# Patient Record
Sex: Female | Born: 1985 | Race: Black or African American | Hispanic: No | Marital: Married | Smoking: Never smoker
Health system: Southern US, Community
[De-identification: ages and names within clinical notes are randomized; demographics above are authoritative.]

## PROBLEM LIST (undated history)

## (undated) ENCOUNTER — Inpatient Hospital Stay (HOSPITAL_COMMUNITY): Payer: Self-pay

## (undated) DIAGNOSIS — E119 Type 2 diabetes mellitus without complications: Secondary | ICD-10-CM

---

## 2014-01-12 DIAGNOSIS — E669 Obesity, unspecified: Secondary | ICD-10-CM | POA: Insufficient documentation

## 2017-02-10 LAB — OB RESULTS CONSOLE HIV ANTIBODY (ROUTINE TESTING): HIV: NONREACTIVE

## 2017-02-10 LAB — OB RESULTS CONSOLE HEPATITIS B SURFACE ANTIGEN: Hepatitis B Surface Ag: NEGATIVE

## 2017-02-10 LAB — OB RESULTS CONSOLE RPR: RPR: NONREACTIVE

## 2017-02-10 LAB — OB RESULTS CONSOLE GC/CHLAMYDIA
Chlamydia: NEGATIVE
GC PROBE AMP, GENITAL: NEGATIVE

## 2017-02-10 LAB — OB RESULTS CONSOLE RUBELLA ANTIBODY, IGM: Rubella: IMMUNE

## 2017-02-25 NOTE — L&D Delivery Note (Addendum)
Delivery Note At 8:37 AM a viable female was delivered via Vaginal, Spontaneous (Presentation: ;  ).  APGAR: 5, 7; weight 4 lb 13.6 oz (2200 g).   Placenta status: , .  Cord:  with the following complications: .  Cord pH: sent  Anesthesia:  Epidural Episiotomy:   Lacerations: 2nd degree Suture Repair: 2.0 chromic Est. Blood Loss (mL): 300  Mom to postpartum.  Baby to NICU.  Nuchal cord times one and true knot in umbilical cord  Meghan Howard A 07/16/2017, 9:28 AM

## 2017-05-01 ENCOUNTER — Encounter (HOSPITAL_COMMUNITY): Payer: Self-pay

## 2017-05-01 ENCOUNTER — Other Ambulatory Visit: Payer: Self-pay

## 2017-05-01 ENCOUNTER — Inpatient Hospital Stay (HOSPITAL_COMMUNITY)
Admission: AD | Admit: 2017-05-01 | Discharge: 2017-05-05 | DRG: 819 | Disposition: A | Discharge status: Home / Self Care | Payer: BLUE CROSS/BLUE SHIELD | Source: Ambulatory Visit | Attending: Obstetrics & Gynecology | Admitting: Obstetrics & Gynecology

## 2017-05-01 DIAGNOSIS — Z3A21 21 weeks gestation of pregnancy: Secondary | ICD-10-CM

## 2017-05-01 DIAGNOSIS — Z880 Allergy status to penicillin: Secondary | ICD-10-CM

## 2017-05-01 DIAGNOSIS — O26879 Cervical shortening, unspecified trimester: Secondary | ICD-10-CM

## 2017-05-01 DIAGNOSIS — Z3686 Encounter for antenatal screening for cervical length: Secondary | ICD-10-CM

## 2017-05-01 DIAGNOSIS — O26872 Cervical shortening, second trimester: Secondary | ICD-10-CM

## 2017-05-01 DIAGNOSIS — O3432 Maternal care for cervical incompetence, second trimester: Principal | ICD-10-CM | POA: Diagnosis present

## 2017-05-01 DIAGNOSIS — O99212 Obesity complicating pregnancy, second trimester: Secondary | ICD-10-CM | POA: Diagnosis present

## 2017-05-01 DIAGNOSIS — Z3A22 22 weeks gestation of pregnancy: Secondary | ICD-10-CM

## 2017-05-01 LAB — CBC
HEMATOCRIT: 31.6 % — AB (ref 36.0–46.0)
Hemoglobin: 10.5 g/dL — ABNORMAL LOW (ref 12.0–15.0)
MCH: 28.7 pg (ref 26.0–34.0)
MCHC: 33.2 g/dL (ref 30.0–36.0)
MCV: 86.3 fL (ref 78.0–100.0)
Platelets: 237 10*3/uL (ref 150–400)
RBC: 3.66 MIL/uL — ABNORMAL LOW (ref 3.87–5.11)
RDW: 13.4 % (ref 11.5–15.5)
WBC: 11.9 10*3/uL — ABNORMAL HIGH (ref 4.0–10.5)

## 2017-05-01 LAB — URINALYSIS, ROUTINE W REFLEX MICROSCOPIC
BILIRUBIN URINE: NEGATIVE
GLUCOSE, UA: NEGATIVE mg/dL
HGB URINE DIPSTICK: NEGATIVE
Ketones, ur: 80 mg/dL — AB
Leukocytes, UA: NEGATIVE
NITRITE: NEGATIVE
PH: 5 (ref 5.0–8.0)
PROTEIN: NEGATIVE mg/dL
Specific Gravity, Urine: 1.014 (ref 1.005–1.030)

## 2017-05-01 LAB — TYPE AND SCREEN
ABO/RH(D): O POS
Antibody Screen: NEGATIVE

## 2017-05-01 MED ORDER — LACTATED RINGERS IV SOLN
INTRAVENOUS | Status: DC
  Administered 2017-05-01 – 2017-05-03 (×6): via INTRAVENOUS

## 2017-05-01 MED ORDER — PROGESTERONE MICRONIZED 200 MG PO CAPS
200.0000 mg | ORAL_CAPSULE | Freq: Every day | ORAL | Status: DC
  Administered 2017-05-01 – 2017-05-05 (×5): 200 mg via VAGINAL
  Filled 2017-05-01 (×5): qty 1

## 2017-05-01 MED ORDER — PROGESTERONE MICRONIZED 200 MG PO CAPS: 200.0000 mg | ORAL_CAPSULE | Freq: Every day | ORAL | Status: DC

## 2017-05-01 NOTE — H&P (Signed)
Anterpartum History and Physical  [name] is 32 year old G1P0 at 21 weeks 5 days sent from office For admission for possible rescue cerclage placement for cervical insufficiency. Last week at her initial anatomy ultrasound her cervix was noted to be dynamic 2.5-3.1cm And today it was measuring 0.8cm.  Her cervix was closed on examination by Dr. Estanislado Howard.  Patient denies vaginal bleeding or leaking of fluid. She denies pelvic pain or contractions.  She reports feeling movement.  Her prenatal labs thus far have been normal  Pregnancy complicated by Cervical insufficiency  PMHX History reviewed. No pertinent past medical history.  PSURG HX History reviewed. No pertinent surgical history.  FAM HX  Family History  Problem Relation Age of Onset  . Diabetes Mother   . Diabetes Father     SOC HX  Social History   Socioeconomic History  . Marital status: Married    Spouse name: Not on file  . Number of children: Not on file  . Years of education: Not on file  . Highest education level: Not on file  Social Needs  . Financial resource strain: Not on file  . Food insecurity - worry: Not on file  . Food insecurity - inability: Not on file  . Transportation needs - medical: Not on file  . Transportation needs - non-medical: Not on file  Occupational History  . Not on file  Tobacco Use  . Smoking status: Not on file  Substance and Sexual Activity  . Alcohol use: No    Frequency: Never  . Drug use: No  . Sexual activity: Not on file  Other Topics Concern  . Not on file  Social History Narrative  . Not on file    ROS  Review of Systems - as per HPI  US in office: SIUP cephalic anterior placenta cervical shortening 0.8-1.1cm with funnel  Physical Exam:  Not done  Assessment: IUP 4157w6d  Plan: Admit to antepartum NPO Cerclage in am by Dr. Estanislado Howard MFM consultation  [name], Meghan Howard

## 2017-05-02 ENCOUNTER — Observation Stay (HOSPITAL_COMMUNITY): Payer: BLUE CROSS/BLUE SHIELD | Admitting: Anesthesiology

## 2017-05-02 ENCOUNTER — Encounter (HOSPITAL_COMMUNITY)
Admission: AD | Disposition: A | Discharge status: Home / Self Care | Payer: Self-pay | Source: Ambulatory Visit | Attending: Obstetrics & Gynecology

## 2017-05-02 ENCOUNTER — Observation Stay (HOSPITAL_COMMUNITY): Payer: BLUE CROSS/BLUE SHIELD

## 2017-05-02 ENCOUNTER — Encounter (HOSPITAL_COMMUNITY): Payer: Self-pay | Admitting: Anesthesiology

## 2017-05-02 HISTORY — PX: CERVICAL CERCLAGE: SHX1329

## 2017-05-02 LAB — ABO/RH: ABO/RH(D): O POS

## 2017-05-02 SURGERY — CERCLAGE, CERVIX, VAGINAL APPROACH
Anesthesia: Spinal | Site: Vagina

## 2017-05-02 MED ORDER — IBUPROFEN 100 MG/5ML PO SUSP
600.0000 mg | Freq: Four times a day (QID) | ORAL | Status: AC
  Administered 2017-05-02 – 2017-05-05 (×12): 600 mg via ORAL
  Filled 2017-05-02 (×12): qty 30

## 2017-05-02 MED ORDER — ONDANSETRON HCL 4 MG/2ML IJ SOLN
INTRAMUSCULAR | Status: DC | PRN
  Administered 2017-05-02: 4 mg via INTRAVENOUS

## 2017-05-02 MED ORDER — PHENYLEPHRINE HCL 10 MG/ML IJ SOLN
INTRAMUSCULAR | Status: DC | PRN
  Administered 2017-05-02 (×3): 80 ug via INTRAVENOUS

## 2017-05-02 MED ORDER — FENTANYL CITRATE (PF) 100 MCG/2ML IJ SOLN: 25.0000 ug | INTRAMUSCULAR | Status: DC | PRN

## 2017-05-02 MED ORDER — IBUPROFEN 600 MG PO TABS
600.0000 mg | ORAL_TABLET | Freq: Four times a day (QID) | ORAL | Status: DC
  Filled 2017-05-02: qty 1

## 2017-05-02 MED ORDER — ONDANSETRON HCL 4 MG/2ML IJ SOLN
INTRAMUSCULAR | Status: AC
  Filled 2017-05-02: qty 2

## 2017-05-02 MED ORDER — SOD CITRATE-CITRIC ACID 500-334 MG/5ML PO SOLN
ORAL | Status: AC
  Administered 2017-05-02: 30 mL
  Filled 2017-05-02: qty 15

## 2017-05-02 MED ORDER — PHENYLEPHRINE 40 MCG/ML (10ML) SYRINGE FOR IV PUSH (FOR BLOOD PRESSURE SUPPORT)
PREFILLED_SYRINGE | INTRAVENOUS | Status: AC
  Filled 2017-05-02: qty 10

## 2017-05-02 MED ORDER — CLINDAMYCIN PHOSPHATE 900 MG/50ML IV SOLN
900.0000 mg | Freq: Once | INTRAVENOUS | Status: AC
  Administered 2017-05-02: 900 mg via INTRAVENOUS
  Filled 2017-05-02: qty 50

## 2017-05-02 SURGICAL SUPPLY — 17 items
CANISTER SUCT 3000ML PPV (MISCELLANEOUS) ×2 IMPLANT
CATH FOLEY 2WAY SLVR 18FR 30CC (CATHETERS) ×2 IMPLANT
GLOVE BIOGEL PI IND STRL 7.0 (GLOVE) ×2 IMPLANT
GLOVE BIOGEL PI INDICATOR 7.0 (GLOVE) ×2
GOWN STRL REUS W/TWL LRG LVL3 (GOWN DISPOSABLE) ×4 IMPLANT
NEEDLE MAYO CATGUT SZ4 (NEEDLE) ×2 IMPLANT
PACK VAGINAL MINOR WOMEN LF (CUSTOM PROCEDURE TRAY) ×2 IMPLANT
PAD OB MATERNITY 4.3X12.25 (PERSONAL CARE ITEMS) ×2 IMPLANT
PAD PREP 24X48 CUFFED NSTRL (MISCELLANEOUS) ×2 IMPLANT
SUT MERSILENE 5MM BP 1 12 (SUTURE) ×2 IMPLANT
SUT PROLENE 0 CT 1 30 (SUTURE) ×2 IMPLANT
SYRINGE 35CC LL (MISCELLANEOUS) ×2 IMPLANT
TOWEL OR 17X24 6PK STRL BLUE (TOWEL DISPOSABLE) ×4 IMPLANT
TRAY FOLEY CATH SILVER 14FR (SET/KITS/TRAYS/PACK) ×2 IMPLANT
TUBING NON-CON 1/4 X 20 CONN (TUBING) ×2 IMPLANT
WATER STERILE IRR 1000ML POUR (IV SOLUTION) ×2 IMPLANT
YANKAUER SUCT BULB TIP NO VENT (SUCTIONS) ×2 IMPLANT

## 2017-05-02 NOTE — Anesthesia Preprocedure Evaluation (Signed)
Anesthesia Evaluation  Patient identified by MRN, date of birth, ID band Patient awake    Reviewed: Allergy & Precautions, H&P , Patient's Chart, lab work & pertinent test results  Airway Mallampati: II  TM Distance: >3 FB Neck ROM: full    Dental no notable dental hx.    Pulmonary    Pulmonary exam normal breath sounds clear to auscultation       Cardiovascular Exercise Tolerance: Good  Rhythm:regular Rate:Normal     Neuro/Psych    GI/Hepatic   Endo/Other  Morbid obesity  Renal/GU      Musculoskeletal   Abdominal   Peds  Hematology   Anesthesia Other Findings   Reproductive/Obstetrics                             Anesthesia Physical Anesthesia Plan  ASA: II  Anesthesia Plan: Spinal   Post-op Pain Management:    Induction:   PONV Risk Score and Plan: Treatment may vary due to age or medical condition and Ondansetron  Airway Management Planned:   Additional Equipment:   Intra-op Plan:   Post-operative Plan:   Informed Consent: I have reviewed the patients History and Physical, chart, labs and discussed the procedure including the risks, benefits and alternatives for the proposed anesthesia with the patient or authorized representative who has indicated his/her understanding and acceptance.     Plan Discussed with:   Anesthesia Plan Comments: (  )        Anesthesia Quick Evaluation

## 2017-05-02 NOTE — Progress Notes (Signed)
Post-procedure visit Patient is doing well, mild cramping now resolving. Op findings reviewed and questions answered.  Will check CBC in am as patient is high risk for infection/SROM Plan u/s for cervical length next week

## 2017-05-02 NOTE — Anesthesia Postprocedure Evaluation (Signed)
Anesthesia Post Note  Patient: Meghan Howard  Procedure(s) Performed: CERCLAGE CERVICAL (N/A Vagina )     Patient location during evaluation: [facility] Anesthesia Type: Spinal Level of consciousness: awake and alert and oriented Pain management: satisfactory to patient Vital Signs Assessment: post-procedure vital signs reviewed and stable Respiratory status: respiratory function stable and spontaneous breathing Cardiovascular status: blood pressure returned to baseline Postop Assessment: no headache, no backache, spinal receding, patient able to bend at knees and adequate PO intake Anesthetic complications: no    Last Vitals:  Vitals:   05/02/17 1625 05/02/17 1753  BP: 122/68 116/73  Pulse: 96 (!) 113  Resp: 18   Temp: 37.1 C 37.5 C  SpO2: 100% 98%    Last Pain:  Vitals:   05/02/17 1753  TempSrc: Oral  PainSc:    Pain Goal:                 Meghan Howard

## 2017-05-02 NOTE — Addendum Note (Signed)
Addendum  created 05/02/17 1831 by Matayah Reyburn O, CRNA   Sign clinical note    

## 2017-05-02 NOTE — Anesthesia Postprocedure Evaluation (Signed)
Anesthesia Post Note  Patient: Meghan Howard  Procedure(s) Performed: CERCLAGE CERVICAL (N/A Vagina )     Patient location during evaluation: PACU Anesthesia Type: Spinal Level of consciousness: awake Pain management: satisfactory to patient Vital Signs Assessment: post-procedure vital signs reviewed and stable Respiratory status: spontaneous breathing Cardiovascular status: blood pressure returned to baseline Postop Assessment: no headache and spinal receding Anesthetic complications: no    Last Vitals:  Vitals:   05/02/17 1509 05/02/17 1625  BP: 121/81 122/68  Pulse: 91 96  Resp: 18 18  Temp: 37.4 C 37.1 C  SpO2: 100% 100%    Last Pain:  Vitals:   05/02/17 1625  TempSrc: Oral  PainSc:    Pain Goal:                 Jiles GarterJACKSON,Haakon Titsworth EDWARD

## 2017-05-02 NOTE — Anesthesia Procedure Notes (Signed)
Spinal  Patient location during procedure: OR Staffing Anesthesiologist: Cristela BlueJackson, Caisen Mangas, MD Spinal Block Patient position: sitting Prep: DuraPrep Patient monitoring: heart rate, blood pressure and continuous pulse ox Approach: right paramedian Location: L3-4 Injection technique: single-shot Needle Needle type: Sprotte  Needle gauge: 24 G Needle length: 9 cm Assessment Sensory level: T4 Additional Notes Spinal Dosage in OR  .75% Bupivicaine ml       1.2

## 2017-05-02 NOTE — Op Note (Signed)
Preoperative diagnosis:  22 weeks pregnancy with cervical incompetence  Postoperative diagnosis: Same  Anesthesia: Spinal  Anesthesiologist: Dr Cristela BlueKyle Jackson  Procedure: McDonald rescue cervical cerclage   Surgeon: Dr. Dois DavenportSandra Tachina Spoonemore  Assistant: None  Estimated blood loss: Minimal  Procedure:  After being informed of the planned procedure with possible complications including but not limited to bleeding, infection,accidental rupture of membranes with subsequent pregnancy loss, premature delivery despite the cervical cerclage, informed consent is obtained and patient is taken to or # 4.  She is given spinal anesthesia without complications. She's placed in the lithotomy position, prepped and draped in a sterile fashion.A Foley catheter is inserted in the bladder.  Pelvic exam reveals: 90 % effaced cervix fingertip  A speculum is inserted in the vagina. The cervix is grasped with an open ring forceps at 12 o'clock. We proceed with a [employer]'s cerclage using a 0-Mersilene mesh starting at 12 o'clock and taking care to place each needle passage as high as possible on the utero-cervical junction. A 0-Prolene stitch is inserted in the Mersilene knot for easier removal.  Instruments are removed.   Post-procedure vaginal exam: reveals a closed cervix with 8-10 mm in lenght  Instrument and sponge count is complete x2. Estimated blood loss is minimal. The procedure is very well tolerated by the patient who is taken to recovery room in a well and stable condition.

## 2017-05-02 NOTE — Transfer of Care (Signed)
Immediate Anesthesia Transfer of Care Note  Patient: Meghan Howard  Procedure(s) Performed: CERCLAGE CERVICAL (N/A Vagina )  Patient Location: PACU  Anesthesia Type:Spinal  Level of Consciousness: awake, alert  and oriented  Airway & Oxygen Therapy: Patient Spontanous Breathing  Post-op Assessment: Report given to RN and Post -op Vital signs reviewed and stable  Post vital signs: Reviewed and stable  Last Vitals:  Vitals:   05/02/17 0841 05/02/17 1251  BP: 117/72 (P) 112/66  Pulse: 90   Resp: 16 (P) 20  Temp: 36.8 C (P) 36.7 C  SpO2: 100%     Last Pain:  Vitals:   05/02/17 0918  TempSrc:   PainSc: 0-No pain         Complications: No apparent anesthesia complications

## 2017-05-02 NOTE — Consult Note (Addendum)
Maternal Fetal Medicine Consultation  I had the pleasure of seeing your patient Meghan Howard for Maternal-Fetal Medicine consultation on 05/01/2017. As you know, Meghan Howard is a 32 y.o. G1P0 at 4754w0d who presents for consultation regarding an incidental diagnosis of short cervix.  Meghan Howard presented for routine anatomy last week and was found to have a dynamic cervix. She presented yesterday for follow-up cervical length which showed a cervical length of 8-7911mm with a large funnel. I am not able to review these images. She was admitted overnight for observation with plan for possible cerclage placement today. Meghan Howard has had no signs or symptoms of infection. WBC on admission was normal at 11.9.   Meghan Howard feels well today and denies fever, chills, abdominal pain, cramping, bleeding, or leakage of fluid. This pregnancy has been otherwise uncomplicated.  Prior to our visit today Meghan Howard had a repeat cervical length ultrasound that showed 4mm residual closed cervical length with a large funnel and a significant amount of debris at the internal os. Please see separate document for fetal ultrasound report.  The remainder of Meghan Howard's medical history is unremarkable. Meghan past surgical history is significant for wisdom teeth removal. She takes prenatal vitamins and is allergic to penicillin. Meghan Howard denies alcohol, tobacco or other drug use. Meghan family history is noncontributory.  I reviewed the findings of today's ultrasound with Meghan Howard and Meghan Howard. We discussed the implications of short cervix without a history of preterm birth. We discussed options to reduce the risk of periviable and premature delivery including vaginal progesterone and cervical cerclage placement. We also discussed the option of expectant management with high likelihood for delivery in the periviable period over the next few weeks. I reviewed that the available data do show a significant benefit for vaginal progesterone for improved outcomes in the  setting of short cervix without a history of preterm birth. The data regarding cerclage in this setting is less robust, but newer data has shown a significant benefit when the cervical length is less than 1cm. I discussed the risks of cerclage including bleeding, infection, rupture of membranes, cervical trauma, and periviable or extreme preterm delivery. After our discussion Meghan Howard confirmed she would like to proceed with cerclage placement. I also recommend starting vaginal progesterone 200mg  nightly as well. There is no evidence for tocolytics or antibiotics in this setting. I also discussed my recommendation for pelvic rest following cerclage placement. However, bedrest is not recommended and has been associated with adverse pregnancy outcomes without pregnancy prolongation.   Recommendations: 1. Proceed with cerclage as scheduled 2. Begin Prometrium 200mg  PV nightly 3. Consider NICU consultation to discuss timing of interventions for fetal benefit in the periviable period 4. Follow-up cervical length ultrasound in 1-2 weeks to assess for betamethasone administration depending on gestational age at which intervention is planned 5. Pelvic rest only; complete bedrest is not recommended.   Discussed with primary OB Dr. Estanislado Pandyivard.  Thank you for the opportunity to be a part of the care of Meghan Howard. Please contact our office if we can be of further assistance.   Level 3  Darlyn ReadEmily Kierria Feigenbaum, MD Maternal-Fetal Medicine

## 2017-05-02 NOTE — Progress Notes (Signed)
Pt to OR in stable condition via w/c.

## 2017-05-02 NOTE — Interval H&P Note (Signed)
History and Physical Interval Note:  05/02/2017 11:15 AM  Meghan Howard  has presented today for surgery, with the diagnosis of Incompetent Cervix  The various methods of treatment have been discussed with the patient and family. After consideration of risks, benefits and other options for treatment, the patient has consented to  Procedure(s): CERCLAGE CERVICAL (N/A) as a surgical intervention .  The patient's history has been reviewed, patient examined, no change in status, stable for surgery.  I have reviewed the patient's chart and labs.  Questions were answered to the patient's satisfaction.    Patient was seen by MFM today with a cervical length of 0.4 cm and large funneling. Recommend to proceed with placement of cerclage and continue vaginal progesterone. Patient understands the risks of rescue cerclage and accidental rupture of membranes.    Dois DavenportSandra A Ardis Fullwood

## 2017-05-03 ENCOUNTER — Encounter (HOSPITAL_COMMUNITY): Payer: Self-pay | Admitting: Obstetrics and Gynecology

## 2017-05-03 LAB — CBC WITH DIFFERENTIAL/PLATELET
BASOS PCT: 0 %
Basophils Absolute: 0 10*3/uL (ref 0.0–0.1)
EOS ABS: 0.1 10*3/uL (ref 0.0–0.7)
EOS PCT: 1 %
HCT: 27.4 % — ABNORMAL LOW (ref 36.0–46.0)
HEMOGLOBIN: 9.4 g/dL — AB (ref 12.0–15.0)
Lymphocytes Relative: 19 %
Lymphs Abs: 2 10*3/uL (ref 0.7–4.0)
MCH: 29.5 pg (ref 26.0–34.0)
MCHC: 34.3 g/dL (ref 30.0–36.0)
MCV: 85.9 fL (ref 78.0–100.0)
Monocytes Absolute: 0.3 10*3/uL (ref 0.1–1.0)
Monocytes Relative: 3 %
NEUTROS PCT: 77 %
Neutro Abs: 8.2 10*3/uL — ABNORMAL HIGH (ref 1.7–7.7)
PLATELETS: 202 10*3/uL (ref 150–400)
RBC: 3.19 MIL/uL — AB (ref 3.87–5.11)
RDW: 13.5 % (ref 11.5–15.5)
WBC: 10.6 10*3/uL — AB (ref 4.0–10.5)

## 2017-05-03 NOTE — Progress Notes (Addendum)
Post-operative  day # 1 cervical cerclage  pregnancy at 9575w1d  S: well, reports good fetal activity      Contractions:none      Vaginal bleeding:none now and resolved       Vaginal discharge: no significant change  O: BP 133/85 (BP Location: Right Arm)   Pulse (!) 104   Temp 98.6 F (37 C) (Oral)   Resp 18   Ht 5\' 3"  (1.6 m)   Wt 114.3 kg (252 lb)   LMP 12/07/2016 (Approximate)   SpO2 99%   BMI 44.64 kg/m       Uterus gravid and non-tender      Extremities: no significant edema and no signs of DVT   WBC 10.6 from 11.9  A: 4275w1d with cervical incompetence s/p cerclage     stable  P: continue current plan of care      Daily vaginal progesterone  Crist FatSandra A Larcenia Holaday  MD 05/03/2017 9:57 AM

## 2017-05-03 NOTE — Plan of Care (Signed)
Patient is resting in bed at this time. Patient denies any pain or discomfort at this time. Patient denies any vaginal bleeding or discharge at this time. Patient is encouraged to call for assistance when needed. Patient verbalizes an understanding.

## 2017-05-04 DIAGNOSIS — O99212 Obesity complicating pregnancy, second trimester: Secondary | ICD-10-CM | POA: Diagnosis present

## 2017-05-04 DIAGNOSIS — Z3A21 21 weeks gestation of pregnancy: Secondary | ICD-10-CM | POA: Diagnosis not present

## 2017-05-04 DIAGNOSIS — O3432 Maternal care for cervical incompetence, second trimester: Secondary | ICD-10-CM | POA: Diagnosis present

## 2017-05-04 DIAGNOSIS — Z880 Allergy status to penicillin: Secondary | ICD-10-CM | POA: Diagnosis not present

## 2017-05-04 LAB — TYPE AND SCREEN
ABO/RH(D): O POS
ANTIBODY SCREEN: NEGATIVE

## 2017-05-04 NOTE — Progress Notes (Signed)
Name: Meghan Howard Medical Record Number:  [id number] Date of Birth: 04/04/1985 Date of Service: 05/04/2017  32 y.o. G1P0 1772w2d HD#1 admitted for EMERGENCY CERCLAGE.  POD#2 s/p cerclage placement  Pt currently stable with no complaints. She denies contractions, no vaginal bleeding, no leaking of fluid. Reports good FM.  The patient's past medical history and prenatal records were reviewed.  Additional issues addressed and updated today: Patient Active Problem List   Diagnosis Date Noted  . Cervical incompetence during pregnancy in second trimester 05/01/2017   Family History  Problem Relation Age of Onset  . Diabetes Mother   . Diabetes Father    Social History   Socioeconomic History  . Marital status: Married    Spouse name: None  . Number of children: None  . Years of education: None  . Highest education level: None  Social Needs  . Financial resource strain: None  . Food insecurity - worry: None  . Food insecurity - inability: None  . Transportation needs - medical: None  . Transportation needs - non-medical: None  Occupational History  . None  Tobacco Use  . Smoking status: Never Smoker  . Smokeless tobacco: Never Used  Substance and Sexual Activity  . Alcohol use: No    Frequency: Never  . Drug use: No  . Sexual activity: Yes  Other Topics Concern  . None  Social History Narrative  . None   Vitals:   05/04/17 0934 05/04/17 1125  BP: 113/71 114/65  Pulse: 85 94  Resp: 18 18  Temp: 98.9 F (37.2 C) 98.5 F (36.9 C)  SpO2: 100% 99%     Physical Examination:   Vitals:   05/04/17 0934 05/04/17 1125  BP: 113/71 114/65  Pulse: 85 94  Resp: 18 18  Temp: 98.9 F (37.2 C) 98.5 F (36.9 C)  SpO2: 100% 99%   General appearance - alert, well appearing, and in no distress  FHTs  150s by doptone Toco  No ctx  Cervix: not evaluated  Results for orders placed or performed during the hospital encounter of 05/01/17 (from the past 24 hour(s))  Type and  screen [facility]     Status: None   Collection Time: 05/04/17  6:00 AM  Result Value Ref Range   ABO/RH(D) O POS    Antibody Screen NEG    Sample Expiration      05/07/2017 Performed at [facility], [address] [place], [place] [address]     ASSESSMENT:  HD#1  6872w2d with cervical insufficiency s/p rescue cerclage placement.  PLAN: D/C IV Stop Toco Patient scheduled to have f/u MFM ultrasound in morning, then she can be discharged home  [facility], [facility]ALDA STACIA

## 2017-05-05 ENCOUNTER — Inpatient Hospital Stay (HOSPITAL_COMMUNITY): Payer: BLUE CROSS/BLUE SHIELD

## 2017-05-05 MED ORDER — PROGESTERONE MICRONIZED 200 MG PO CAPS: 200.0000 mg | ORAL_CAPSULE | Freq: Every day | ORAL | 3 refills | Status: DC

## 2017-05-05 NOTE — Progress Notes (Signed)
Hospital day # 2 pregnancy at 638w3d--Cervical incompetence and cerclage placement.  S:  Doing fine.      Perception of contractions: none      Vaginal bleeding: none now       Vaginal discharge:  no significant change  O: BP 121/72 (BP Location: Left Arm)   Pulse 96   Temp 98.3 F (36.8 C) (Oral)   Resp 16   Ht 5\' 3"  (1.6 m)   Wt 114.3 kg (252 lb)   LMP 12/07/2016 (Approximate)   SpO2 100%   BMI 44.64 kg/m       Fetal tracings:Cat 1      Contractions:   None      Uterus gravid, consistent with 22 weeks and non-tender      Extremities: extremities normal, atraumatic, no cyanosis or edema and no significant edema and no signs of DVT          Labs:  None       Meds: Prometrium 200mg  vaginal, PNV  A: 638w3d with cervical incompetence     stable  P: Continue current plan of care      Upcoming tests/treatments:  MFM US      MDs will follow  Kenney HousemanNancy Jean Ammi Hutt CNM, MSN 05/05/2017 7:03 AM

## 2017-05-05 NOTE — Discharge Instructions (Signed)
Cervical Cerclage Cervical cerclage is a surgical procedure to correct a cervix that opens up and thins out before pregnancy is at term (cervical insufficiency, also called incompetent cervix). This condition can cause labor to start early (prematurely). This procedure involves using stitches to sew the cervix shut during pregnancy. Your surgeon may use ultrasound equipment to help guide the procedure and monitor your baby. Ultrasound equipment uses sound waves to take images of your cervix and uterus. Your surgeon will assess these images on a monitor in the operating room. Tell a health care provider about:  Any allergies you have, especially any allergies related to prescribed medicine, stitches, or anesthetic medicines.  All medicines you are taking, including vitamins, herbs, eye drops, creams, and over-the-counter medicines. Bring a list of all of your medicines to your appointment.  Your medical history, including prior labor deliveries.  Any problems you or family members have had with anesthetic medicines.  Any blood disorders you have.  Any surgeries you have had, including prior cervical stitching.  Any medical conditions you have.  Whether you are pregnant or may be pregnant. What are the risks? Generally, this is a safe procedure. However, problems may occur, including:  Infection, such as infection of the cervix or amniotic sac.  Vaginal bleeding.  Allergic reactions to medicines.  Damage to other structures or organs, such as tearing (rupture) of membranes or cervical laceration.  Premature contractions including going into early labor and delivery.  Cervical dystocia, which occurs when the cervix is unable to dilate normally during labor.  What happens before the procedure? Staying hydrated Follow instructions from your health care provider about hydration, which may include:  Up to 2 hours before the procedure - you may continue to drink clear liquids, such as  water, clear fruit juice, black coffee, and plain tea.  Eating and drinking restrictions Follow instructions from your health care provider about eating and drinking, which may include:  8 hours before the procedure - stop eating heavy meals or foods such as meat, fried foods, or fatty foods.  6 hours before the procedure - stop eating light meals or foods, such as toast or cereal.  6 hours before the procedure - stop drinking milk or drinks that contain milk.  2 hours before the procedure - stop drinking clear liquids.  Medicines  Ask your health care provider about: ? Changing or stopping your regular medicines. This is especially important if you are taking diabetes medicines or blood thinners. ? Taking medicines such as aspirin and ibuprofen. These medicines can thin your blood. Do not take these medicines before your procedure if your health care provider instructs you not to.  You may be given antibiotic medicine to help prevent infection. General instructions  Do not put on any lotion, deodorant, or perfume.  Remove contact lenses and jewelry.  Ask your health care provider how your surgical site will be marked or identified.  You may have an exam or testing.  You may have a blood or urine sample taken.  Plan to have someone take you home from the hospital or clinic.  If you will be going home right after the procedure, plan to have someone with you for 24 hours. What happens during the procedure?  To reduce your risk of infection: ? Your health care team will wash or sanitize their hands. ? Your skin will be washed with soap.  An IV tube will be inserted into one of your veins.  You may be given   one or more of the following: ? A medicine to help you relax (sedative). ? A medicine to numb the area (local anesthetic). ? A medicine to make you fall asleep (general anesthetic). ? A medicine that is injected into your spine to numb the area below and slightly above  the injection site (spinal anesthetic). ? A medicine that is injected into an area of your body to numb everything below the injection site (regional anesthetic).  A lubricated instrument (speculum) will be inserted into your vagina. The speculum will be widened to open the walls of your vagina so your surgeon can see your cervix.  Your cervix will be grasped and tightly stitched closed (sutured). To do this, your surgeon will stitch a strong band of thread around your cervix, then the thread will be tightened to hold your cervix shut. The procedure may vary among health care providers and hospitals. What happens after the procedure?  Your blood pressure, heart rate, breathing rate, and blood oxygen level will be monitored until the medicines you were given have worn off. You will be monitored for premature contractions.  You may have light bleeding and mild cramping.  You may have to wear compression stockings. These stockings help to prevent blood clots and reduce swelling in your legs.  Do not drive for 24 hours if you received a sedative.  You may be put on bed rest.  You may be given medicine to prevent infection.  You may be given an injection of a hormone (progesterone) to prevent your uterus from tightening (contracting). Summary  Cervical cerclage is a surgical procedure that involves using stitches to sew the cervix shut during pregnancy.  Your blood pressure, heart rate, breathing rate, and blood oxygen level will be monitored until the medicines you were given have worn off. You will be monitored for premature contractions.  You may need to be on bed rest after the procedure.  Plan to have someone take you home from the hospital or clinic. This information is not intended to replace advice given to you by your health care provider. Make sure you discuss any questions you have with your health care provider. Document Released: 01/25/2008 Document Revised: 10/06/2015  Document Reviewed: 09/28/2015 Elsevier Interactive Patient Education  2018 [employer]  

## 2017-05-05 NOTE — Discharge Summary (Signed)
OB Discharge Summary     Patient Name: Meghan Howard DOB: 1986-01-01 MRN: [id number]  Date of admission: 05/01/2017 Delivering MD: This patient has no babies on file.  Date of discharge: 05/05/2017  Admitting diagnosis: EMERGENCY CERCLAGE Intrauterine pregnancy: [redacted]w[redacted]d     Secondary diagnosis:  Active Problems:   Cervical incompetence during pregnancy in second trimester  Additional problems: none     Discharge diagnosis: stable with cervical length of 3.1cm and funneling                                                                                                Post partum procedures:n/a  Augmentation: n/a  Complications: cervical incompetence s/p emergency cervical cerclage  Hospital course:  Pt was admitted with bulging membranes.  Emergent cervical cerclage placed.  Pt has remained stable in hospital on bedrest.  F/u u/s showed good placement of cerclage with 3.1cm cervix with funneling.  Discharge precautions reviewed and pt instructed to remain on bedrest with f/u in the office next week for u/s to recheck cerclage and visit.  Note for work to excuse until further notice.  Physical exam  Vitals:   05/04/17 1955 05/05/17 0743 05/05/17 1154 05/05/17 1548  BP: 121/72 116/68 133/74 137/65  Pulse: 96 88 97 99  Resp: 16 16 16 16   Temp: 98.3 F (36.8 C) 98.9 F (37.2 C) 98.2 F (36.8 C) 98.8 F (37.1 C)  TempSrc: Oral Oral Oral Oral  SpO2: 100% 98% 99% 99%  Weight:      Height:       General: alert, cooperative and no distress Lochia: n/a Uterine Fundus: gravid, NT, abd soft Incision: N/A DVT Evaluation: no calf tenderness Labs: Lab Results  Component Value Date   WBC 10.6 (H) 05/03/2017   HGB 9.4 (L) 05/03/2017   HCT 27.4 (L) 05/03/2017   MCV 85.9 05/03/2017   PLT 202 05/03/2017   No flowsheet data found.  Discharge instruction: per After Visit Summary and "Baby and Me Booklet".  After visit meds:  Allergies as of 05/05/2017      Reactions   Penicillins Hives   Has patient had a PCN reaction causing immediate rash, facial/tongue/throat swelling, SOB or lightheadedness with hypotension: Yes Has patient had a PCN reaction causing severe rash involving mucus membranes or skin necrosis: Yes Has patient had a PCN reaction that required hospitalization: Yes Has patient had a PCN reaction occurring within the last 10 years: No If all of the above answers are "NO", then may proceed with Cephalosporin use.      Medication List    TAKE these medications   omega-3 acid ethyl esters 1 g capsule Commonly known as:  LOVAZA Take 1 g by mouth daily.   prenatal multivitamin Tabs tablet Take 1 tablet by mouth daily at 12 noon.   progesterone 200 MG capsule Commonly known as:  PROMETRIUM Place 1 capsule (200 mg total) vaginally at bedtime.       Diet: routine diet  Activity: Advance as tolerated. Pelvic rest for 6 weeks.   Outpatient follow up:1 week Follow up Appt:No future appointments.  Follow up Visit:No Follow-up on file.  Postpartum contraception: n/a  Newborn Data: This patient has no babies on file. Baby Feeding: n/a Disposition:stable   05/05/2017 Purcell NailsAngela Y Adaya Garmany, MD

## 2017-05-16 ENCOUNTER — Inpatient Hospital Stay (HOSPITAL_COMMUNITY)
Admission: AD | Admit: 2017-05-16 | Discharge: 2017-05-16 | Disposition: A | Discharge status: Home / Self Care | Payer: BLUE CROSS/BLUE SHIELD | Source: Ambulatory Visit | Attending: Obstetrics & Gynecology | Admitting: Obstetrics & Gynecology

## 2017-05-16 ENCOUNTER — Other Ambulatory Visit: Payer: Self-pay

## 2017-05-16 DIAGNOSIS — O3432 Maternal care for cervical incompetence, second trimester: Secondary | ICD-10-CM | POA: Diagnosis present

## 2017-05-16 DIAGNOSIS — Z3A23 23 weeks gestation of pregnancy: Secondary | ICD-10-CM | POA: Insufficient documentation

## 2017-05-16 MED ORDER — BETAMETHASONE SOD PHOS & ACET 6 (3-3) MG/ML IJ SUSP: 12.0000 mg | Freq: Once | INTRAMUSCULAR | Status: DC

## 2017-05-16 MED ORDER — BETAMETHASONE SOD PHOS & ACET 6 (3-3) MG/ML IJ SUSP
12.0000 mg | INTRAMUSCULAR | Status: DC
  Administered 2017-05-16: 12 mg via INTRAMUSCULAR
  Filled 2017-05-16 (×2): qty 2

## 2017-05-16 NOTE — MAU Note (Signed)
Pt presents for BMZ injection, denies any c/o or issues.

## 2017-05-16 NOTE — MAU Provider Note (Signed)
Cerclage placed 05/02/17 by Dr. Estanislado Pandyivard. Repeat US on 3/20--cervix 0.7-1.7, dynamic, with funneling. Per consult with Dr. Su Hiltoberts that day, plan betamethasone course at 23 6/7 weeks. Patient to MAU 05/16/17 and 05/17/17 for injections. Order placed.  Nigel BridgemanVicki Sherrita Riederer, CNM 05/16/17 12:33p

## 2017-05-17 ENCOUNTER — Inpatient Hospital Stay (HOSPITAL_COMMUNITY)
Admission: AD | Admit: 2017-05-17 | Discharge: 2017-05-17 | Disposition: A | Discharge status: Home / Self Care | Payer: BLUE CROSS/BLUE SHIELD | Source: Ambulatory Visit | Attending: Obstetrics & Gynecology | Admitting: Obstetrics & Gynecology

## 2017-05-17 DIAGNOSIS — Z3A00 Weeks of gestation of pregnancy not specified: Secondary | ICD-10-CM | POA: Diagnosis not present

## 2017-05-17 DIAGNOSIS — O4700 False labor before 37 completed weeks of gestation, unspecified trimester: Secondary | ICD-10-CM | POA: Diagnosis not present

## 2017-05-17 MED ORDER — BETAMETHASONE SOD PHOS & ACET 6 (3-3) MG/ML IJ SUSP
12.0000 mg | Freq: Once | INTRAMUSCULAR | Status: AC
  Administered 2017-05-17: 12 mg via INTRAMUSCULAR
  Filled 2017-05-17: qty 2

## 2017-05-21 ENCOUNTER — Other Ambulatory Visit (HOSPITAL_COMMUNITY): Payer: Self-pay | Admitting: Obstetrics and Gynecology

## 2017-05-21 DIAGNOSIS — Z3A24 24 weeks gestation of pregnancy: Secondary | ICD-10-CM

## 2017-05-21 DIAGNOSIS — O3432 Maternal care for cervical incompetence, second trimester: Secondary | ICD-10-CM

## 2017-05-28 ENCOUNTER — Encounter (HOSPITAL_COMMUNITY): Payer: Self-pay

## 2017-05-29 ENCOUNTER — Other Ambulatory Visit (HOSPITAL_COMMUNITY): Payer: Self-pay | Admitting: Obstetrics and Gynecology

## 2017-05-29 ENCOUNTER — Encounter (HOSPITAL_COMMUNITY): Payer: Self-pay

## 2017-05-29 ENCOUNTER — Ambulatory Visit (HOSPITAL_COMMUNITY)
Admission: RE | Admit: 2017-05-29 | Discharge: 2017-05-29 | Disposition: A | Discharge status: Home / Self Care | Payer: BLUE CROSS/BLUE SHIELD | Source: Ambulatory Visit | Attending: Obstetrics and Gynecology | Admitting: Obstetrics and Gynecology

## 2017-05-29 DIAGNOSIS — Z3A25 25 weeks gestation of pregnancy: Secondary | ICD-10-CM | POA: Insufficient documentation

## 2017-05-29 DIAGNOSIS — Z3686 Encounter for antenatal screening for cervical length: Secondary | ICD-10-CM | POA: Insufficient documentation

## 2017-05-29 DIAGNOSIS — O3432 Maternal care for cervical incompetence, second trimester: Secondary | ICD-10-CM | POA: Diagnosis present

## 2017-05-29 DIAGNOSIS — Z3A24 24 weeks gestation of pregnancy: Secondary | ICD-10-CM

## 2017-05-30 ENCOUNTER — Other Ambulatory Visit (HOSPITAL_COMMUNITY): Payer: Self-pay | Admitting: Obstetrics and Gynecology

## 2017-05-30 DIAGNOSIS — N883 Incompetence of cervix uteri: Secondary | ICD-10-CM

## 2017-05-31 DIAGNOSIS — O409XX0 Polyhydramnios, unspecified trimester, not applicable or unspecified: Secondary | ICD-10-CM | POA: Insufficient documentation

## 2017-06-05 DIAGNOSIS — O24419 Gestational diabetes mellitus in pregnancy, unspecified control: Secondary | ICD-10-CM | POA: Insufficient documentation

## 2017-06-09 ENCOUNTER — Other Ambulatory Visit (HOSPITAL_COMMUNITY): Payer: Self-pay | Admitting: Obstetrics and Gynecology

## 2017-06-09 ENCOUNTER — Ambulatory Visit (HOSPITAL_COMMUNITY)
Admission: RE | Admit: 2017-06-09 | Discharge: 2017-06-09 | Disposition: A | Discharge status: Home / Self Care | Payer: BLUE CROSS/BLUE SHIELD | Source: Ambulatory Visit | Attending: Obstetrics and Gynecology | Admitting: Obstetrics and Gynecology

## 2017-06-09 DIAGNOSIS — Z3686 Encounter for antenatal screening for cervical length: Secondary | ICD-10-CM | POA: Insufficient documentation

## 2017-06-09 DIAGNOSIS — O3432 Maternal care for cervical incompetence, second trimester: Secondary | ICD-10-CM | POA: Diagnosis not present

## 2017-06-09 DIAGNOSIS — N883 Incompetence of cervix uteri: Secondary | ICD-10-CM

## 2017-06-09 DIAGNOSIS — Z3A27 27 weeks gestation of pregnancy: Secondary | ICD-10-CM | POA: Insufficient documentation

## 2017-06-09 DIAGNOSIS — O402XX0 Polyhydramnios, second trimester, not applicable or unspecified: Secondary | ICD-10-CM | POA: Diagnosis not present

## 2017-06-09 DIAGNOSIS — Z3A26 26 weeks gestation of pregnancy: Secondary | ICD-10-CM

## 2017-06-09 DIAGNOSIS — O24419 Gestational diabetes mellitus in pregnancy, unspecified control: Secondary | ICD-10-CM | POA: Insufficient documentation

## 2017-06-18 ENCOUNTER — Ambulatory Visit: Payer: BLUE CROSS/BLUE SHIELD | Admitting: Registered"

## 2017-06-25 ENCOUNTER — Encounter: Payer: BLUE CROSS/BLUE SHIELD | Attending: Obstetrics and Gynecology | Admitting: Registered"

## 2017-06-25 DIAGNOSIS — O99810 Abnormal glucose complicating pregnancy: Secondary | ICD-10-CM | POA: Insufficient documentation

## 2017-06-25 DIAGNOSIS — Z713 Dietary counseling and surveillance: Secondary | ICD-10-CM | POA: Insufficient documentation

## 2017-06-27 ENCOUNTER — Encounter: Payer: Self-pay | Admitting: Registered"

## 2017-06-27 DIAGNOSIS — O99810 Abnormal glucose complicating pregnancy: Secondary | ICD-10-CM | POA: Insufficient documentation

## 2017-06-27 NOTE — Progress Notes (Signed)
Patient was seen on 06/25/17 for Gestational Diabetes self-management class at the [facility]. The following learning objectives were met by the patient during this course:   States the definition of Gestational Diabetes  States why dietary management is important in controlling blood glucose  Describes the effects each nutrient has on blood glucose levels  Demonstrates ability to create a balanced meal plan  Demonstrates carbohydrate counting   States when to check blood glucose levels  Demonstrates proper blood glucose monitoring techniques  States the effect of stress and exercise on blood glucose levels  States the importance of limiting caffeine and abstaining from alcohol and smoking  Blood glucose monitor given: none (pt currently checking BG)  Patient instructed to monitor glucose levels: FBS: 60 - <95; 1 hour: <140; 2 hour: <120  Patient received handouts:  Nutrition Diabetes and Pregnancy, including carb counting list  Patient will be seen for follow-up as needed. 

## 2017-07-15 ENCOUNTER — Inpatient Hospital Stay (HOSPITAL_COMMUNITY)
Admission: AD | Admit: 2017-07-15 | Discharge: 2017-07-18 | DRG: 768 | Disposition: A | Discharge status: Home / Self Care | Payer: BLUE CROSS/BLUE SHIELD | Source: Ambulatory Visit | Attending: Obstetrics and Gynecology | Admitting: Obstetrics and Gynecology

## 2017-07-15 ENCOUNTER — Encounter (HOSPITAL_COMMUNITY): Payer: Self-pay

## 2017-07-15 ENCOUNTER — Other Ambulatory Visit: Payer: Self-pay

## 2017-07-15 ENCOUNTER — Inpatient Hospital Stay (HOSPITAL_COMMUNITY): Payer: BLUE CROSS/BLUE SHIELD

## 2017-07-15 DIAGNOSIS — Z3A32 32 weeks gestation of pregnancy: Secondary | ICD-10-CM

## 2017-07-15 DIAGNOSIS — O24420 Gestational diabetes mellitus in childbirth, diet controlled: Principal | ICD-10-CM | POA: Diagnosis present

## 2017-07-15 DIAGNOSIS — O403XX0 Polyhydramnios, third trimester, not applicable or unspecified: Secondary | ICD-10-CM

## 2017-07-15 DIAGNOSIS — O3433 Maternal care for cervical incompetence, third trimester: Secondary | ICD-10-CM | POA: Diagnosis present

## 2017-07-15 DIAGNOSIS — O99824 Streptococcus B carrier state complicating childbirth: Secondary | ICD-10-CM | POA: Diagnosis present

## 2017-07-15 DIAGNOSIS — O409XX0 Polyhydramnios, unspecified trimester, not applicable or unspecified: Secondary | ICD-10-CM

## 2017-07-15 DIAGNOSIS — O99214 Obesity complicating childbirth: Secondary | ICD-10-CM | POA: Diagnosis present

## 2017-07-15 DIAGNOSIS — O9902 Anemia complicating childbirth: Secondary | ICD-10-CM | POA: Diagnosis present

## 2017-07-15 DIAGNOSIS — D649 Anemia, unspecified: Secondary | ICD-10-CM | POA: Diagnosis present

## 2017-07-15 DIAGNOSIS — O479 False labor, unspecified: Secondary | ICD-10-CM | POA: Diagnosis present

## 2017-07-15 DIAGNOSIS — O4700 False labor before 37 completed weeks of gestation, unspecified trimester: Secondary | ICD-10-CM | POA: Diagnosis present

## 2017-07-15 HISTORY — DX: Type 2 diabetes mellitus without complications: E11.9

## 2017-07-15 LAB — URINALYSIS, ROUTINE W REFLEX MICROSCOPIC
Bilirubin Urine: NEGATIVE
Ketones, ur: 20 mg/dL — AB
NITRITE: NEGATIVE
PH: 6 (ref 5.0–8.0)
PROTEIN: NEGATIVE mg/dL
Specific Gravity, Urine: 1.026 (ref 1.005–1.030)

## 2017-07-15 LAB — WET PREP, GENITAL
CLUE CELLS WET PREP: NONE SEEN
Sperm: NONE SEEN
TRICH WET PREP: NONE SEEN
Yeast Wet Prep HPF POC: NONE SEEN

## 2017-07-15 LAB — CBC
HCT: 36.2 % (ref 36.0–46.0)
Hemoglobin: 11.6 g/dL — ABNORMAL LOW (ref 12.0–15.0)
MCH: 28.2 pg (ref 26.0–34.0)
MCHC: 32 g/dL (ref 30.0–36.0)
MCV: 88.1 fL (ref 78.0–100.0)
PLATELETS: 282 10*3/uL (ref 150–400)
RBC: 4.11 MIL/uL (ref 3.87–5.11)
RDW: 13.6 % (ref 11.5–15.5)
WBC: 14.3 10*3/uL — ABNORMAL HIGH (ref 4.0–10.5)

## 2017-07-15 LAB — TYPE AND SCREEN
ABO/RH(D): O POS
ANTIBODY SCREEN: NEGATIVE

## 2017-07-15 LAB — GLUCOSE, CAPILLARY
GLUCOSE-CAPILLARY: 167 mg/dL — AB (ref 65–99)
Glucose-Capillary: 129 mg/dL — ABNORMAL HIGH (ref 65–99)

## 2017-07-15 LAB — GROUP B STREP BY PCR: Group B strep by PCR: POSITIVE — AB

## 2017-07-15 MED ORDER — ACETAMINOPHEN 325 MG PO TABS: 650.0000 mg | ORAL_TABLET | ORAL | Status: DC | PRN

## 2017-07-15 MED ORDER — NIFEDIPINE 10 MG PO CAPS
20.0000 mg | ORAL_CAPSULE | Freq: Once | ORAL | Status: AC
  Administered 2017-07-15: 20 mg via ORAL
  Filled 2017-07-15: qty 2

## 2017-07-15 MED ORDER — LACTATED RINGERS IV BOLUS
1000.0000 mL | Freq: Once | INTRAVENOUS | Status: AC
  Administered 2017-07-15: 1000 mL via INTRAVENOUS

## 2017-07-15 MED ORDER — MAGNESIUM SULFATE 40 G IN LACTATED RINGERS - SIMPLE
2.0000 g/h | INTRAVENOUS | Status: DC
  Filled 2017-07-15: qty 500

## 2017-07-15 MED ORDER — LACTATED RINGERS IV SOLN
INTRAVENOUS | Status: DC
  Administered 2017-07-15 (×2): via INTRAVENOUS

## 2017-07-15 MED ORDER — NIFEDIPINE 10 MG PO CAPS
10.0000 mg | ORAL_CAPSULE | Freq: Once | ORAL | Status: AC
  Administered 2017-07-15: 10 mg via ORAL
  Filled 2017-07-15: qty 1

## 2017-07-15 MED ORDER — ZOLPIDEM TARTRATE 5 MG PO TABS: 5.0000 mg | ORAL_TABLET | Freq: Every evening | ORAL | Status: DC | PRN

## 2017-07-15 MED ORDER — DOCUSATE SODIUM 100 MG PO CAPS
100.0000 mg | ORAL_CAPSULE | Freq: Every day | ORAL | Status: DC
  Filled 2017-07-15 (×2): qty 1

## 2017-07-15 MED ORDER — MAGNESIUM SULFATE BOLUS VIA INFUSION
4.0000 g | Freq: Once | INTRAVENOUS | Status: AC
  Administered 2017-07-15: 4 g via INTRAVENOUS
  Filled 2017-07-15: qty 500

## 2017-07-15 MED ORDER — CALCIUM CARBONATE ANTACID 500 MG PO CHEW: 2.0000 | CHEWABLE_TABLET | ORAL | Status: DC | PRN

## 2017-07-15 MED ORDER — PRENATAL MULTIVITAMIN CH
1.0000 | ORAL_TABLET | Freq: Every day | ORAL | Status: DC
  Filled 2017-07-15 (×2): qty 1

## 2017-07-15 NOTE — H&P (Addendum)
Meghan Howard is a 32 y.o. female presenting for contractions.  Leaked fluid earlier and thought it was urine.  No VB and good FM.  OB History    Gravida  1   Para      Term      Preterm      AB      Living  0     SAB      TAB      Ectopic      Multiple      Live Births             Past Medical History:  Diagnosis Date  . Diabetes mellitus without complication (HCC)    Gestational Diabetes   Past Surgical History:  Procedure Laterality Date  . CERVICAL CERCLAGE N/A 05/02/2017   Procedure: CERCLAGE CERVICAL;  Surgeon: Silverio Lay, MD;  Location: [facility];  Service: Gynecology;  Laterality: N/A;   Family History: family history includes Diabetes in her father, maternal grandmother, and mother; Hypertension in her father and mother. Social History:  reports that she has never smoked. She has never used smokeless tobacco. She reports that she does not drink alcohol or use drugs.     Maternal Diabetes: Yes:  Diabetes Type:  Diet controlled Genetic Screening: Normal Maternal Ultrasounds/Referrals: Normal Fetal Ultrasounds or other Referrals:  None Maternal Substance Abuse:  No Significant Maternal Medications:  None Significant Maternal Lab Results:  Lab values include: Group B Strep positive Other Comments:  cerclage placed on 05/12/17 and s/p BMZ  on 3/22 and 3/23  ROS  Non-contributory, denies F/C/N/V/D  History Dilation: Fingertip Effacement (%): 80 Station: Ballotable Exam by:: Su Hilt, CNM Blood pressure 125/83, pulse (!) 118, temperature 98.5 F (36.9 C), temperature source Oral, resp. rate 18, height  (1.6 m), weight 264 lb (119.7 kg), last menstrual period 11/30/2016. Exam Physical Exam  Lungs CTA CV RRR Abd gravid, NT Ext no calf tenderness VE as above and cerclage in place Prenatal labs: ABO, Rh: --/--/O POS (05/21 1355) Antibody: PENDING (05/21 1355) Rubella:  Immune RPR:   Nonreactive HBsAg:   Nonreactive HIV:   Nonreactive GBS:    positive  Assessment/Plan: P0 at 41 3/7wks being admitted with preterm contractions, cerclage in place and s/p BMZ.  No decrease in contractions after procardia  and slight decrease after  but still contracting q but less intense.  Pt is GDM and diet controlled although CBG was 167 at about 1330 and last ate at 0900.  I suspect she will need medication.  FHT cat 1.   Purcell Nails 07/15/2017, 2:48 PM  1700 Pt is continuing to contract in spite of 4g bolus and 2g/hr of magnesium for neuroprophylaxis.  She states they feel less frequent and lighter but she continues to contract every 2-5 min.  I discussed removing cerclage in order to prevent tearing through cervix.  Pt understands that she may end up delivering.  She understands that baby will go to NICU if delivers now but is grateful she has gotten this far.  Questions answered.  Will confirm presentation with bedside ultrasound and order Neonatology consult as well. FHT is cat 1.  1740 Beside Ultrasound - cephalic presentation.  Graves speculum placed in vagina.  Cerclage stitch grasped with a ringed forcep and incised and removed.  Minimal bleeding at site.  Membranes visible.  VE loose 1cm, 80% and ballotable.  FHT cat 1.

## 2017-07-15 NOTE — MAU Note (Signed)
Report given to Glory Rosebush, RN. Advised to bring pt in 20 min for admission to [facility] specialty care unit.

## 2017-07-15 NOTE — Consult Note (Signed)
Neonatology Consult  Note:  At the request of the patients obstetrician Dr. Mancel Bale I met with Meghan Howard who is a 32 y.o. female who presented in preterm labor at 51 3/[redacted] weeks gestation.  She had a cerclage in place which was removed today and is s/p betamethasone.  Pregnancy also complicated by diet controlled GDM.  She is currently receiving magnesium sulfate   We reviewed initial delivery room management, including CPAP, [place], and low but certainly possible need for intubation for surfactant administration.  We discussed feeding immaturity and need for full po intake with multiple days of good weight gain and no apnea or bradycardia before discharge.  We reviewed increased risk of jaundice, infection, and temperature instability.   Discussed likely length of stay.  Thank you for allowing [place] to participate in her care.  Please call with questions.  Higinio Roger, DO  Neonatologist   The total length of face-to-face or floor / unit time for this encounter was 25 minutes.  Counseling and / or coordination of care was greater than fifty percent of the time.

## 2017-07-15 NOTE — Progress Notes (Signed)
Baby's BPP was 6/8. The ultrasound technician notified Dr. Su Hilt. Will continue continous monitor

## 2017-07-15 NOTE — MAU Note (Signed)
Pt presents to MAU with c/o lower abdominal pain that started last night and increased in intensity today. Pt denies VB and LOF. +FM

## 2017-07-16 ENCOUNTER — Encounter (HOSPITAL_COMMUNITY): Payer: Self-pay | Admitting: *Deleted

## 2017-07-16 ENCOUNTER — Inpatient Hospital Stay (HOSPITAL_COMMUNITY): Payer: BLUE CROSS/BLUE SHIELD | Admitting: Anesthesiology

## 2017-07-16 LAB — GLUCOSE, CAPILLARY
GLUCOSE-CAPILLARY: 158 mg/dL — AB (ref 65–99)
Glucose-Capillary: 169 mg/dL — ABNORMAL HIGH (ref 65–99)
Glucose-Capillary: 142 mg/dL — ABNORMAL HIGH (ref 65–99)
Glucose-Capillary: 167 mg/dL — ABNORMAL HIGH (ref 65–99)

## 2017-07-16 MED ORDER — PHENYLEPHRINE 40 MCG/ML (10ML) SYRINGE FOR IV PUSH (FOR BLOOD PRESSURE SUPPORT)
80.0000 ug | PREFILLED_SYRINGE | INTRAVENOUS | Status: DC | PRN
  Filled 2017-07-16: qty 5

## 2017-07-16 MED ORDER — LIDOCAINE HCL (PF) 1 % IJ SOLN
30.0000 mL | INTRAMUSCULAR | Status: AC | PRN
  Administered 2017-07-16: 30 mL via SUBCUTANEOUS
  Filled 2017-07-16: qty 30

## 2017-07-16 MED ORDER — ONDANSETRON HCL 4 MG/2ML IJ SOLN: 4.0000 mg | Freq: Four times a day (QID) | INTRAMUSCULAR | Status: DC | PRN

## 2017-07-16 MED ORDER — DIBUCAINE 1 % RE OINT
1.0000 "application " | TOPICAL_OINTMENT | RECTAL | Status: DC | PRN
  Administered 2017-07-17: 1 via RECTAL
  Filled 2017-07-16 (×2): qty 28

## 2017-07-16 MED ORDER — LACTATED RINGERS IV SOLN: 500.0000 mL | Freq: Once | INTRAVENOUS | Status: DC

## 2017-07-16 MED ORDER — BENZOCAINE-MENTHOL 20-0.5 % EX AERO
1.0000 "application " | INHALATION_SPRAY | CUTANEOUS | Status: DC | PRN
  Administered 2017-07-16: 1 via TOPICAL
  Filled 2017-07-16 (×2): qty 56

## 2017-07-16 MED ORDER — OXYCODONE-ACETAMINOPHEN 5-325 MG PO TABS
1.0000 | ORAL_TABLET | ORAL | Status: DC | PRN
  Administered 2017-07-17: 1 via ORAL
  Filled 2017-07-16: qty 1

## 2017-07-16 MED ORDER — PHENYLEPHRINE 40 MCG/ML (10ML) SYRINGE FOR IV PUSH (FOR BLOOD PRESSURE SUPPORT)
PREFILLED_SYRINGE | INTRAVENOUS | Status: AC
  Filled 2017-07-16: qty 20

## 2017-07-16 MED ORDER — SOD CITRATE-CITRIC ACID 500-334 MG/5ML PO SOLN: 30.0000 mL | ORAL | Status: DC | PRN

## 2017-07-16 MED ORDER — VANCOMYCIN HCL IN DEXTROSE 1-5 GM/200ML-% IV SOLN
1000.0000 mg | Freq: Two times a day (BID) | INTRAVENOUS | Status: DC
  Administered 2017-07-16: 1000 mg via INTRAVENOUS
  Filled 2017-07-16 (×2): qty 200

## 2017-07-16 MED ORDER — IBUPROFEN 600 MG PO TABS
600.0000 mg | ORAL_TABLET | Freq: Four times a day (QID) | ORAL | Status: DC
  Administered 2017-07-16 – 2017-07-18 (×7): 600 mg via ORAL
  Filled 2017-07-16 (×8): qty 1

## 2017-07-16 MED ORDER — FENTANYL CITRATE (PF) 100 MCG/2ML IJ SOLN: 50.0000 ug | INTRAMUSCULAR | Status: DC | PRN

## 2017-07-16 MED ORDER — CLINDAMYCIN PHOSPHATE 900 MG/50ML IV SOLN: 900.0000 mg | Freq: Three times a day (TID) | INTRAVENOUS | Status: DC

## 2017-07-16 MED ORDER — SIMETHICONE 80 MG PO CHEW
80.0000 mg | CHEWABLE_TABLET | ORAL | Status: DC | PRN
Start: 2017-07-16 — End: 2017-07-18

## 2017-07-16 MED ORDER — ACETAMINOPHEN 325 MG PO TABS: 650.0000 mg | ORAL_TABLET | ORAL | Status: DC | PRN

## 2017-07-16 MED ORDER — SENNOSIDES-DOCUSATE SODIUM 8.6-50 MG PO TABS
2.0000 | ORAL_TABLET | ORAL | Status: DC
  Administered 2017-07-17: 2 via ORAL
  Filled 2017-07-16 (×2): qty 2

## 2017-07-16 MED ORDER — TETANUS-DIPHTH-ACELL PERTUSSIS 5-2.5-18.5 LF-MCG/0.5 IM SUSP: 0.5000 mL | Freq: Once | INTRAMUSCULAR | Status: DC

## 2017-07-16 MED ORDER — LACTATED RINGERS IV SOLN: 500.0000 mL | INTRAVENOUS | Status: DC | PRN

## 2017-07-16 MED ORDER — FENTANYL 2.5 MCG/ML BUPIVACAINE 1/10 % EPIDURAL INFUSION (WH - ANES)
14.0000 mL/h | INTRAMUSCULAR | Status: DC | PRN
  Administered 2017-07-16: 14 mL/h via EPIDURAL

## 2017-07-16 MED ORDER — OXYTOCIN 40 UNITS IN LACTATED RINGERS INFUSION - SIMPLE MED
2.5000 [IU]/h | INTRAVENOUS | Status: DC
  Filled 2017-07-16: qty 1000

## 2017-07-16 MED ORDER — LACTATED RINGERS IV SOLN
INTRAVENOUS | Status: DC
  Administered 2017-07-16: 03:00:00 via INTRAVENOUS

## 2017-07-16 MED ORDER — LIDOCAINE HCL (PF) 1 % IJ SOLN
INTRAMUSCULAR | Status: DC | PRN
  Administered 2017-07-16: 5 mL via EPIDURAL
  Administered 2017-07-16: 3 mL via EPIDURAL
  Administered 2017-07-16: 2 mL via EPIDURAL

## 2017-07-16 MED ORDER — ONDANSETRON HCL 4 MG/2ML IJ SOLN: 4.0000 mg | INTRAMUSCULAR | Status: DC | PRN

## 2017-07-16 MED ORDER — FENTANYL 2.5 MCG/ML BUPIVACAINE 1/10 % EPIDURAL INFUSION (WH - ANES)
INTRAMUSCULAR | Status: AC
  Filled 2017-07-16: qty 100

## 2017-07-16 MED ORDER — DIPHENHYDRAMINE HCL 50 MG/ML IJ SOLN: 12.5000 mg | INTRAMUSCULAR | Status: DC | PRN

## 2017-07-16 MED ORDER — WITCH HAZEL-GLYCERIN EX PADS
1.0000 "application " | MEDICATED_PAD | CUTANEOUS | Status: DC | PRN
  Administered 2017-07-17: 1 via TOPICAL

## 2017-07-16 MED ORDER — EPHEDRINE 5 MG/ML INJ
10.0000 mg | INTRAVENOUS | Status: DC | PRN
  Filled 2017-07-16: qty 2

## 2017-07-16 MED ORDER — COCONUT OIL OIL
1.0000 "application " | TOPICAL_OIL | Status: DC | PRN
  Filled 2017-07-16: qty 120

## 2017-07-16 MED ORDER — OXYCODONE-ACETAMINOPHEN 5-325 MG PO TABS: 2.0000 | ORAL_TABLET | ORAL | Status: DC | PRN

## 2017-07-16 MED ORDER — MEASLES, MUMPS & RUBELLA VAC ~~LOC~~ INJ
0.5000 mL | INJECTION | Freq: Once | SUBCUTANEOUS | Status: DC
  Filled 2017-07-16: qty 0.5

## 2017-07-16 MED ORDER — LACTATED RINGERS IV SOLN
500.0000 mL | Freq: Once | INTRAVENOUS | Status: AC
  Administered 2017-07-16: 500 mL via INTRAVENOUS

## 2017-07-16 MED ORDER — OXYTOCIN BOLUS FROM INFUSION
500.0000 mL | Freq: Once | INTRAVENOUS | Status: AC
  Administered 2017-07-16: 500 mL via INTRAVENOUS

## 2017-07-16 MED ORDER — DIPHENHYDRAMINE HCL 25 MG PO CAPS: 25.0000 mg | ORAL_CAPSULE | Freq: Four times a day (QID) | ORAL | Status: DC | PRN

## 2017-07-16 MED ORDER — ONDANSETRON HCL 4 MG PO TABS
4.0000 mg | ORAL_TABLET | ORAL | Status: DC | PRN
Start: 2017-07-16 — End: 2017-07-18

## 2017-07-16 NOTE — Anesthesia Preprocedure Evaluation (Signed)
Anesthesia Evaluation  Patient identified by MRN, date of birth, ID band Patient awake    Reviewed: Allergy & Precautions, NPO status , Patient's Chart, lab work & pertinent test results  Airway Mallampati: II  TM Distance: >3 FB Neck ROM: Full    Dental  (+) Teeth Intact, Dental Advisory Given   Pulmonary neg pulmonary ROS,    Pulmonary exam normal breath sounds clear to auscultation       Cardiovascular negative cardio ROS Normal cardiovascular exam Rhythm:Regular Rate:Normal     Neuro/Psych negative neurological ROS     GI/Hepatic negative GI ROS, Neg liver ROS,   Endo/Other  diabetes, GestationalMorbid obesity  Renal/GU negative Renal ROS     Musculoskeletal negative musculoskeletal ROS (+)   Abdominal   Peds  Hematology  (+) Blood dyscrasia, anemia , Plt 282k   Anesthesia Other Findings Day of surgery medications reviewed with the patient.  Reproductive/Obstetrics (+) Pregnancy 32wks                             Anesthesia Physical Anesthesia Plan  ASA: III  Anesthesia Plan: Epidural   Post-op Pain Management:    Induction:   PONV Risk Score and Plan: 2 and Treatment may vary due to age or medical condition  Airway Management Planned:   Additional Equipment:   Intra-op Plan:   Post-operative Plan:   Informed Consent: I have reviewed the patients History and Physical, chart, labs and discussed the procedure including the risks, benefits and alternatives for the proposed anesthesia with the patient or authorized representative who has indicated his/her understanding and acceptance.   Dental advisory given  Plan Discussed with:   Anesthesia Plan Comments: (Patient identified. Risks/Benefits/Options discussed with patient including but not limited to bleeding, infection, nerve damage, paralysis, failed block, incomplete pain control, headache, blood pressure changes,  nausea, vomiting, reactions to medication both or allergic, itching and postpartum back pain. Confirmed with bedside nurse the patient's most recent platelet count. Confirmed with patient that they are not currently taking any anticoagulation, have any bleeding history or any family history of bleeding disorders. Patient expressed understanding and wished to proceed. All questions were answered. )        Anesthesia Quick Evaluation

## 2017-07-16 NOTE — Anesthesia Procedure Notes (Signed)
Epidural Patient location during procedure: OB Start time: 07/16/2017 2:22 AM End time: 07/16/2017 2:27 AM  Staffing Anesthesiologist: Cecile Hearing, MD Performed: anesthesiologist   Preanesthetic Checklist Completed: patient identified, pre-op evaluation, timeout performed, IV checked, risks and benefits discussed and monitors and equipment checked  Epidural Patient position: sitting Prep: DuraPrep Patient monitoring: blood pressure and continuous pulse ox Approach: midline Location: L3-L4 Injection technique: LOR air  Needle:  Needle type: Tuohy  Needle gauge: 17 G Needle length: 9 cm Needle insertion depth: 8 cm Catheter size: 19 Gauge Catheter at skin depth: 14 cm Test dose: negative and Other (1% Lidocaine)  Additional Notes Patient identified.  Risk benefits discussed including failed block, incomplete pain control, headache, nerve damage, paralysis, blood pressure changes, nausea, vomiting, reactions to medication both toxic or allergic, and postpartum back pain.  Patient expressed understanding and wished to proceed.  All questions were answered.  Sterile technique used throughout procedure and epidural site dressed with sterile barrier dressing. No paresthesia or other complications noted. The patient did not experience any signs of intravascular injection such as tinnitus or metallic taste in mouth nor signs of intrathecal spread such as rapid motor block. Please see nursing notes for vital signs. Reason for block:procedure for pain

## 2017-07-16 NOTE — Progress Notes (Signed)
Patient transferred to labor and delivery after cervical exam by Illene Bolus, CNM. Baby was 4 centimeters with a bulging bag. Transferred via bed and gave handoff to Darlin Priestly, Rn.

## 2017-07-16 NOTE — Progress Notes (Signed)
LABOR PROGRESS NOTE  Meghan Howard is a 32 y.o. G1P0 at [redacted]w[redacted]d  admitted for preterm labor; cerclage removed on 07/15/17; Subjective: magnesium for CP Prophylaxis infusing and can be dc'ed at 330am.  RN called from High risk unit stating pt had started having more regular contractions and was hurting .  Vertex confirmed on BPP 07/16/17   Objective: BP 110/75   Pulse 96   Temp (!) 97.5 F (36.4 C) (Oral)   Resp 16   Ht  (1.6 m)   Wt 119.7 kg (264 lb)   LMP 11/30/2016   SpO2 100%   BMI 46.77 kg/m  or  Vitals:   07/15/17 2100 07/15/17 2200 07/15/17 2300 07/16/17 0150  BP: 136/78 136/74 132/74 110/75  Pulse: 95 89 89 96  Resp: Temp:    (!) 97.5 F (36.4 C)  TempSrc:    Oral  SpO2:      Weight:      Height:         Dilation: 4 Effacement (%): 90 Station: (Bulging bag) Presentation: Vertex Exam by:: Lori Clemmons  Labs: Lab Results  Component Value Date   WBC 14.3 (H) 07/15/2017   HGB 11.6 (L) 07/15/2017   HCT 36.2 07/15/2017   MCV 88.1 07/15/2017   PLT 282 07/15/2017    Patient Active Problem List   Diagnosis Date Noted  . Preterm contractions 07/15/2017  . Abnormal glucose tolerance test (GTT) during pregnancy, antepartum 06/27/2017  . Gestational diabetes mellitus 06/05/2017  . Polyhydramnios 05/31/2017  . Cervical incompetence during pregnancy in second trimester 05/01/2017  . Obesity 01/12/2014    Assessment / Plan: IUP @ 32+4 GDM- diet controlled ;poor control GBS positive- allergic to PCN POLyhydramnios BG q 2 hours 12 hours of Magnesium Sulfate for CP Prophylaxis up at 330am Clindamycin  IVPB  q 8 Fetal Wellbeing:  Cat 1 Pain Control: desires epidural Anticipated MOD:  SVD  Lori A Clemmons CNM 07/16/2017, 2:11 AM

## 2017-07-17 LAB — CBC
HCT: 29.4 % — ABNORMAL LOW (ref 36.0–46.0)
HEMOGLOBIN: 9.6 g/dL — AB (ref 12.0–15.0)
MCH: 28.8 pg (ref 26.0–34.0)
MCHC: 32.7 g/dL (ref 30.0–36.0)
MCV: 88.3 fL (ref 78.0–100.0)
Platelets: 227 10*3/uL (ref 150–400)
RBC: 3.33 MIL/uL — AB (ref 3.87–5.11)
RDW: 13.8 % (ref 11.5–15.5)
WBC: 13.3 10*3/uL — ABNORMAL HIGH (ref 4.0–10.5)

## 2017-07-17 LAB — CULTURE, OB URINE

## 2017-07-17 LAB — GLUCOSE, CAPILLARY
GLUCOSE-CAPILLARY: 122 mg/dL — AB (ref 65–99)
GLUCOSE-CAPILLARY: 148 mg/dL — AB (ref 65–99)
GLUCOSE-CAPILLARY: 131 mg/dL — AB (ref 65–99)
Glucose-Capillary: 113 mg/dL — ABNORMAL HIGH (ref 65–99)

## 2017-07-17 NOTE — Progress Notes (Signed)
Patient screened out for psychosocial assessment since none of the following apply: °Psychosocial stressors documented in mother or baby's chart °Gestation less than 32 weeks °Code at delivery  °Infant with anomalies °Please contact the Clinical Social Worker if specific needs arise, by MOB's request, or if MOB scores greater than 9/yes to question 10 on [place] Postpartum Depression Screen. ° °Olof Marcil Boyd-Gilyard, MSW, LCSW °Clinical Social Work °[phone] °  °

## 2017-07-17 NOTE — Lactation Note (Addendum)
This note was copied from a baby's chart. Lactation Consultation Note  Patient Name: Boy Allaya Abbasi [id number] Date: 07/17/2017   Baby 29 hours old [redacted]w[redacted]d in NICU. Reviewed hand expression w/ glistening expressed. Recommend mother post pump 8 times per day for 10-20 min with DEBP on initiation setting. Reviewed cleaning and milk storage.  Provided mother w/ NICU booklet and labels. Mom made aware of O/P services, breastfeeding support groups, community resources, and our phone # for post-discharge questions.  Mother has personal manual pump at home but her insurance does not cover DEBP and does not qualify for [facility]. Discussed the option of renting a pump from [employer].        Maternal Data    Feeding    LATCH Score                   Interventions    Lactation Tools Discussed/Used     Consult Status      Hardie Pulley 07/17/2017, 1:47 PM

## 2017-07-17 NOTE — Anesthesia Postprocedure Evaluation (Signed)
Anesthesia Post Note  Patient: Meghan Howard  Procedure(s) Performed: AN AD HOC LABOR EPIDURAL     Patient location during evaluation: Mother Baby Anesthesia Type: Epidural Level of consciousness: awake Pain management: satisfactory to patient Vital Signs Assessment: post-procedure vital signs reviewed and stable Respiratory status: spontaneous breathing Cardiovascular status: stable Anesthetic complications: no    Last Vitals:  Vitals:   07/17/17 0419 07/17/17 0741  BP: 120/65 115/90  Pulse: 80 77  Resp: 20 18  Temp: 36.7 C 37 C  SpO2: 100% 100%    Last Pain:  Vitals:   07/17/17 0741  TempSrc: Oral  PainSc:    Pain Goal: Patients Stated Pain Goal: 4 (07/17/17 0726)               Cephus Shelling

## 2017-07-17 NOTE — Progress Notes (Signed)
Pt in NICU

## 2017-07-17 NOTE — Progress Notes (Signed)
Post Partum Day 1 Subjective: no complaints, up ad lib, voiding and tolerating PO. Feeling well with mild cramping and vaginal pain from laceration well controlled with pain medication. Not symptomatic of anemia.   Objective: Vitals:   07/16/17 2039 07/17/17 0025 07/17/17 0419 07/17/17 0741  BP: 117/74 115/73 120/65 115/90  Pulse: 86 76 80 77  Resp: Temp: 98.9 F (37.2 C) 97.7 F (36.5 C) 98.1 F (36.7 C) 98.6 F (37 C)  TempSrc:   Oral Oral  SpO2: 100% 99% 100% 100%  Weight:      Height:       Results for orders placed or performed during the hospital encounter of 07/15/17 (from the past 24 hour(s))  Glucose, capillary     Status: Abnormal   Collection Time: 07/16/17  9:54 PM  Result Value Ref Range   Glucose-Capillary 167 (H) 65 - 99 mg/dL  CBC     Status: Abnormal   Collection Time: 07/17/17  6:13 AM  Result Value Ref Range   WBC 13.3 (H) 4.0 - 10.5 K/uL   RBC 3.33 (L) 3.87 - 5.11 MIL/uL   Hemoglobin 9.6 (L) 12.0 - 15.0 g/dL   HCT 19.1 (L) 47.8 - 29.5 %   MCV 88.3 78.0 - 100.0 fL   MCH 28.8 26.0 - 34.0 pg   MCHC 32.7 30.0 - 36.0 g/dL   RDW 62.1 30.8 - 65.7 %   Platelets 227 150 - 400 K/uL  Glucose, capillary     Status: Abnormal   Collection Time: 07/17/17  7:34 AM  Result Value Ref Range   Glucose-Capillary 113 (H) 65 - 99 mg/dL   Comment 1 Notify RN    Comment 2 Document in Chart     Physical Exam:  General: alert and cooperative Lochia: appropriate Uterine Fundus: firm Incision: n/a DVT Evaluation: No evidence of DVT seen on physical exam. Negative Homan's sign. No cords or calf tenderness. No significant calf/ankle edema.  Assessment/Plan: Plan for discharge tomorrow  Send home with Iron supplement for anemia  Continue current plan of care    LOS: 2 days   Janeece Riggers 07/17/2017, 11:02 AM

## 2017-07-18 ENCOUNTER — Encounter (HOSPITAL_COMMUNITY): Payer: Self-pay | Admitting: *Deleted

## 2017-07-18 LAB — GLUCOSE, CAPILLARY: GLUCOSE-CAPILLARY: 110 mg/dL — AB (ref 65–99)

## 2017-07-18 LAB — RPR: RPR Ser Ql: NONREACTIVE

## 2017-07-18 MED ORDER — IBUPROFEN 600 MG PO TABS: 600.0000 mg | ORAL_TABLET | Freq: Four times a day (QID) | ORAL | 0 refills | Status: DC

## 2017-07-18 NOTE — Lactation Note (Addendum)
This note was copied from a baby's chart. Lactation Consultation Note  Patient Name: Meghan Howard [id number] Date: 07/18/2017 Reason for consult: Follow-up assessment;Preterm <34wks;NICU baby;Primapara;1st time breastfeeding  Baby is 34 hours old. Mom for D/C today Per mom has pumped a few times with small amount in the flanges, and using the #27 F and comfortable.  LC reviewed sore nipple and engorgement prevention and tx  LC reminded mom to take her DEBP Kit with her to plan on pumping when in NICU.  Per mom has a DEBP [place] at home as not opened it.   LC discussed with mom that the [place] have been known to be for occasional use DEBP, and since her baby in a 32 week baby and in NICU and stronger pump would be recommended.  LC explored options with mom - either renting a DEBP from [employer] at [facility] , or returning her DEBP and purchasing a DEBP Medela.  Also to plan on pumping when visiting baby in NICU every time she visits.   LC reviewed supply and demand and the importance of breast stimulation at least 8 - 10 x's a day both breast for 15 -20 mins. LC also discussed with mom power pumping once a day ( 10 min on / off for 60 mins or 20 min on 10 mins off over 60 mins ) especially if the number of pumping at not at least 8.   Mother informed of post-discharge support and given phone number to the lactation department, including services for phone call assistance; out-patient appointments; and breastfeeding support group. List of other breastfeeding resources in the community given in the handout. Encouraged mother to call for problems or concerns related to breastfeeding.  Mom aware the LC would be available for questions or breast feeding concerns and if she is visiting baby in NICU to have the RN call.    Maternal Data Has patient been taught Hand Expression?: Yes(per mom was shown by [name] ) Does the patient have breastfeeding experience prior to this delivery?: No  Feeding     LATCH Score                   Interventions Interventions: Breast feeding basics reviewed;DEBP  Lactation Tools Discussed/Used Tools: Pump;Flanges Flange Size: 24;27 Breast pump type: Double-Electric Breast Pump WIC Program: No Pump Review: Setup, frequency, and cleaning   Consult Status Consult Status: PRN Date: (mom for D/C today/ PRN F/U in NICU ) Follow-up type: Other (comment)(in NICU )    Jerlyn [facility] 07/18/2017, 12:28 PM

## 2017-07-18 NOTE — Progress Notes (Signed)
CBG before breakfast 110.

## 2017-07-18 NOTE — Discharge Summary (Signed)
    OB Discharge Summary     Patient Name: Meghan Howard DOB: 1986-01-01 MRN: [id number]  Date of admission: 07/15/2017 Delivering MD: Jaymes Graff   Date of discharge: 07/18/2017  Admitting diagnosis: 32WKS, SEVERE CRAMPS Intrauterine pregnancy: [redacted]w[redacted]d     Secondary diagnosis:  Active Problems:   * No active hospital problems. *  Additional problems: GDM diet controlled     Discharge diagnosis: Preterm Pregnancy Delivered       GDM-diet controlled                                                                                        Post partum procedures:  Augmentation:   Complications:   Hospital course:  Onset of Labor With Vaginal Delivery     32 y.o. yo G1P0101 at [redacted]w[redacted]d was admitted in Active Labor on 07/15/2017. Patient had an uncomplicated labor course as follows:  Membrane Rupture Time/Date: 8:21 AM ,07/16/2017   Intrapartum Procedures: Episiotomy:                                           Lacerations:  2nd degree [3]  Patient had a delivery of a Viable infant. 07/16/2017  Information for the patient's newborn:  Daryn, Pisani [[id number]]  Delivery Method: Vaginal, Spontaneous(Filed from Delivery Summary)    Pateint had an uncomplicated postpartum course.  She is ambulating, tolerating a regular diet, passing flatus, and urinating well. Patient is discharged home in stable condition on 07/18/17.   Physical exam  Vitals:   07/17/17 1601 07/17/17 2030 07/18/17 0347 07/18/17 1056  BP: (!) 138/96 122/77 107/64 124/81  Pulse: 98 93 85 88  Resp: Temp: 98.3 F (36.8 C) 98.7 F (37.1 C) 98.6 F (37 C) 98.5 F (36.9 C)  TempSrc: Oral Oral Oral Oral  SpO2: 100% 99% 99% 100%  Weight:      Height:       General: alert, cooperative and no distress Lochia: appropriate Uterine Fundus: firm Incision:  DVT Evaluation: No evidence of DVT seen on physical exam. Labs: Lab Results  Component Value Date   WBC 13.3 (H) 07/17/2017   HGB 9.6 (L) 07/17/2017    HCT 29.4 (L) 07/17/2017   MCV 88.3 07/17/2017   PLT 227 07/17/2017   No flowsheet data found.  Discharge instruction: per After Visit Summary and "Baby and Me Booklet".  After visit meds: Ibuprofen   Diet: routine diet  Activity: Advance as tolerated. Pelvic rest for 6 weeks.   Outpatient follow up:6 weeks Follow up Appt:No future appointments. Follow up Visit:No follow-ups on file.  Postpartum contraception: Undecided  Newborn Data: Live born female  Birth Weight: 4 lb 13.6 oz (2200 g) APGAR: 5, 7  Newborn Delivery   Birth date/time:  07/16/2017 08:37:00 Delivery type:  Vaginal, Spontaneous     Baby Feeding: Bottle and Breast Disposition:NICU   07/18/2017 Rhea Pink, CNM

## 2017-07-18 NOTE — Discharge Instructions (Signed)

## 2017-07-18 NOTE — Progress Notes (Signed)
Pt Sleeping

## 2018-02-26 ENCOUNTER — Encounter (HOSPITAL_BASED_OUTPATIENT_CLINIC_OR_DEPARTMENT_OTHER): Payer: Self-pay | Admitting: Emergency Medicine

## 2018-02-26 ENCOUNTER — Emergency Department (HOSPITAL_BASED_OUTPATIENT_CLINIC_OR_DEPARTMENT_OTHER): Payer: BLUE CROSS/BLUE SHIELD

## 2018-02-26 ENCOUNTER — Emergency Department (HOSPITAL_BASED_OUTPATIENT_CLINIC_OR_DEPARTMENT_OTHER)
Admission: EM | Admit: 2018-02-26 | Discharge: 2018-02-26 | Disposition: A | Discharge status: Home / Self Care | Payer: BLUE CROSS/BLUE SHIELD | Attending: Emergency Medicine | Admitting: Emergency Medicine

## 2018-02-26 ENCOUNTER — Other Ambulatory Visit: Payer: Self-pay

## 2018-02-26 DIAGNOSIS — I4891 Unspecified atrial fibrillation: Secondary | ICD-10-CM | POA: Diagnosis not present

## 2018-02-26 DIAGNOSIS — R42 Dizziness and giddiness: Secondary | ICD-10-CM | POA: Insufficient documentation

## 2018-02-26 DIAGNOSIS — R002 Palpitations: Secondary | ICD-10-CM | POA: Diagnosis present

## 2018-02-26 DIAGNOSIS — R0602 Shortness of breath: Secondary | ICD-10-CM | POA: Diagnosis not present

## 2018-02-26 DIAGNOSIS — R0789 Other chest pain: Secondary | ICD-10-CM | POA: Diagnosis not present

## 2018-02-26 LAB — CBC WITH DIFFERENTIAL/PLATELET
Abs Immature Granulocytes: 0.02 10*3/uL (ref 0.00–0.07)
BASOS PCT: 0 %
Basophils Absolute: 0 10*3/uL (ref 0.0–0.1)
Eosinophils Absolute: 0 10*3/uL (ref 0.0–0.5)
Eosinophils Relative: 1 %
HCT: 43 % (ref 36.0–46.0)
Hemoglobin: 13 g/dL (ref 12.0–15.0)
Immature Granulocytes: 0 %
Lymphocytes Relative: 33 %
Lymphs Abs: 2.7 10*3/uL (ref 0.7–4.0)
MCH: 26.3 pg (ref 26.0–34.0)
MCHC: 30.2 g/dL (ref 30.0–36.0)
MCV: 86.9 fL (ref 80.0–100.0)
Monocytes Absolute: 0.3 10*3/uL (ref 0.1–1.0)
Monocytes Relative: 4 %
NEUTROS ABS: 5.1 10*3/uL (ref 1.7–7.7)
Neutrophils Relative %: 62 %
Platelets: 288 10*3/uL (ref 150–400)
RBC: 4.95 MIL/uL (ref 3.87–5.11)
RDW: 13 % (ref 11.5–15.5)
WBC: 8.1 10*3/uL (ref 4.0–10.5)
nRBC: 0 % (ref 0.0–0.2)

## 2018-02-26 LAB — BASIC METABOLIC PANEL
ANION GAP: 6 (ref 5–15)
BUN: 16 mg/dL (ref 6–20)
CO2: 25 mmol/L (ref 22–32)
Calcium: 9.3 mg/dL (ref 8.9–10.3)
Chloride: 106 mmol/L (ref 98–111)
Creatinine, Ser: 0.54 mg/dL (ref 0.44–1.00)
GFR calc Af Amer: >60 mL/min (ref >60)
GFR calc non Af Amer: >60 mL/min (ref >60)
GLUCOSE: 103 mg/dL — AB (ref 70–99)
Potassium: 3.7 mmol/L (ref 3.5–5.1)
Sodium: 137 mmol/L (ref 135–145)

## 2018-02-26 LAB — TROPONIN I: Troponin I: <0.03 ng/mL (ref <0.03)

## 2018-02-26 LAB — D-DIMER, QUANTITATIVE: D-Dimer, Quant: <0.27 ug/mL-FEU (ref 0.00–0.50)

## 2018-02-26 LAB — TSH: TSH: 1.216 u[IU]/mL (ref 0.350–4.500)

## 2018-02-26 MED ORDER — APIXABAN 5 MG PO TABS: 5.0000 mg | ORAL_TABLET | Freq: Two times a day (BID) | ORAL | 0 refills | Status: DC

## 2018-02-26 NOTE — ED Notes (Signed)
Patient transported to X-ray 

## 2018-02-26 NOTE — ED Notes (Signed)
ED Provider at bedside. 

## 2018-02-26 NOTE — ED Triage Notes (Signed)
Pt states it felt like her heart ws racing, and she went to PCP. Pt was sent to ED by PCP, stating pt was in afib. Pt NS at this time

## 2018-02-26 NOTE — ED Provider Notes (Signed)
[facility] EMERGENCY DEPARTMENT Provider Note   CSN: [id number] Arrival date & time: 02/26/18  1143     History   Chief Complaint Chief Complaint  Patient presents with  . Palpitations    HPI Meghan Howard is a 33 y.o. female.  HPI  Today was just talking ot mom and suddenly developed palpitations, heart racing very fast, like running a marathon, lightheadedness and dyspnea.  Chest pain felt like heart trying to beat out of chest. Then seemd to slow down but still felt palpitations and saw PCP who did an EKG and said it looked like atrial fibrillation. Now feeling lightheaded somewhat, but no longer having racing or shortness of breath. Has slight chest pain and feels fatigue.    No leg pain or swelling, no recent long trips or surgeries.  No fevers or cough.   Past Medical History:  Diagnosis Date  . Diabetes mellitus without complication [facility](HCC)    Gestational Diabetes    Patient Active Problem List   Diagnosis Date Noted  . Obesity 01/12/2014    Past Surgical History:  Procedure Laterality Date  . CERVICAL CERCLAGE N/A 05/02/2017   Procedure: CERCLAGE CERVICAL;  Surgeon: Silverio Layivard, Sandra, MD;  Location: [facility];  Service: Gynecology;  Laterality: N/A;     OB History    Gravida  1   Para  1   Term      Preterm  1   AB      Living  1     SAB      TAB      Ectopic      Multiple  0   Live Births  1            Home Medications    Prior to Admission medications   Medication Sig Start Date End Date Taking? Authorizing Provider  apixaban (ELIQUIS) 5 MG TABS tablet Take 1 tablet (5 mg total) by mouth 2 (two) times daily. 02/26/18 03/28/18  Alvira MondaySchlossman, Sharine Cadle, MD  omega-3 acid ethyl esters (LOVAZA) 1 g capsule Take 1 g by mouth daily.    [provider]  Prenatal Vit-Fe Fumarate-FA (PRENATAL MULTIVITAMIN) TABS tablet Take 1 tablet by mouth daily at 12 noon.    [provider]  progesterone (PROMETRIUM) 200 MG capsule Place 1  capsule (200 mg total) vaginally at bedtime. 05/05/17   Osborn Cohooberts, Angela, MD    Family History Family History  Problem Relation Age of Onset  . Diabetes Mother   . Hypertension Mother   . Diabetes Father   . Hypertension Father   . Diabetes Maternal Grandmother     Social History Social History   Tobacco Use  . Smoking status: Never Smoker  . Smokeless tobacco: Never Used  Substance Use Topics  . Alcohol use: No    Frequency: Never  . Drug use: No     Allergies   Penicillins   Review of Systems Review of Systems  Constitutional: Positive for fatigue. Negative for fever.  HENT: Negative for sore throat.   Eyes: Negative for visual disturbance.  Respiratory: Positive for shortness of breath. Negative for cough.   Cardiovascular: Positive for chest pain and palpitations. Negative for leg swelling.  Gastrointestinal: Negative for abdominal pain, blood in stool, diarrhea, nausea and vomiting.  Genitourinary: Negative for difficulty urinating.  Musculoskeletal: Negative for back pain and neck pain.  Skin: Negative for rash.  Neurological: Positive for light-headedness. Negative for syncope and headaches.     Physical Exam Updated  Vital Signs BP (!) 140/98 (BP Location: Left Arm)   Pulse 85   Temp 98.6 F (37 C) (Oral)   Resp 18   Ht 5\' 3"  (1.6 m)   Wt 117.9 kg   LMP 02/06/2018   SpO2 100%   BMI 46.06 kg/m   Physical Exam Vitals signs and nursing note reviewed.  Constitutional:      General: She is not in acute distress.    Appearance: She is well-developed. She is not diaphoretic.  HENT:     Head: Normocephalic and atraumatic.  Eyes:     Conjunctiva/sclera: Conjunctivae normal.  Neck:     Musculoskeletal: Normal range of motion.  Cardiovascular:     Rate and Rhythm: Normal rate and regular rhythm.     Heart sounds: Normal heart sounds. No murmur. No friction rub. No gallop.   Pulmonary:     Effort: Pulmonary effort is normal. No respiratory distress.       Breath sounds: Normal breath sounds. No wheezing or rales.  Abdominal:     General: There is no distension.     Palpations: Abdomen is soft.     Tenderness: There is no abdominal tenderness. There is no guarding.  Musculoskeletal:        General: No tenderness.  Skin:    General: Skin is warm and dry.     Findings: No erythema or rash.  Neurological:     Mental Status: She is alert and oriented to person, place, and time.      ED Treatments / Results  Labs (all labs ordered are listed, but only abnormal results are displayed) Labs Reviewed  BASIC METABOLIC PANEL - Abnormal; Notable for the following components:      Result Value   Glucose, Bld 103 (*)    All other components within normal limits  CBC WITH DIFFERENTIAL/PLATELET  D-DIMER, QUANTITATIVE (NOT AT [facility])  TROPONIN I  TSH    EKG EKG Interpretation  Date/Time:  Thursday February 26 2018 11:53:50 EST Ventricular Rate:  96 PR Interval:  136 QRS Duration: 76 QT Interval:  338 QTC Calculation: 427 R Axis:   -23 Text Interpretation:  Normal sinus rhythm Cannot rule out Anterior infarct , age undetermined Abnormal ECG No previous ECGs available Confirmed by Alvira MondaySchlossman, Arsalan Brisbin ([id number]) on 02/26/2018 12:24:41 PM   Radiology Dg Chest 2 View  Result Date: 02/26/2018 CLINICAL DATA:  Chest pain, tachycardia, and shortness of breath. EXAM: CHEST - 2 VIEW COMPARISON:  No priors for comparison. FINDINGS: Cardiac size upper limits normal. No consolidation or edema. No effusion or pneumothorax. Bones unremarkable. IMPRESSION: No active cardiopulmonary disease. Electronically Signed   By: Elsie StainJohn T Curnes M.D.   On: 02/26/2018 13:10    Procedures Procedures (including critical care time)  Medications Ordered in ED Medications - No data to display   Initial Impression / Assessment and Plan / ED Course  I have reviewed the triage vital signs and the nursing notes.  Pertinent labs & imaging results that were available during  my care of the patient were reviewed by me and considered in my medical decision making (see chart for details).     33 year old female with no significant medical problems, presents with concern for palpitations, dyspnea and atrial fibrillation diagnosed at PCPs office.  Reviewed EKG from office, which does appear consistent with atrial fibrillation with a rapid rate, however patient in normal sinus rhythm on presentation to the emergency department here.  She does report some ongoing symptoms of chest  discomfort.  EKG shows no acute findings.  Troponin is negative, low suspicion for ACS.  Pt low risk wells, pain d-dimer checked and negative, and have low suspicion for pulmonary embolism. TSH normal. No sig anemia or electrolyte abnormalities. CXR WNL.   Symptoms today likely secondary to atrial fibrillation. Pt ChasVasc2 score of 1. Will initiate eliquis and have her follow up with Cardiology as scheduled. Given HR and BP lower at this time, will not initiate rate control medication at this time but recommend close follow up with Dr. Jacinto Halim as scheduled. Patient discharged in stable condition with understanding of reasons to return.   Final Clinical Impressions(s) / ED Diagnoses   Final diagnoses:  Atrial fibrillation with RVR [facility])    ED Discharge Orders         Ordered    apixaban (ELIQUIS) 5 MG TABS tablet  2 times daily     02/26/18 1403           Alvira Monday, MD 02/26/18 2122

## 2019-09-25 IMAGING — US US MFM OB TRANSVAGINAL
1 series · 15 of 21 positions shown · non-contrast
Comparison: none

[Series 1: us mfm ob transvaginal · 21 acquisitions, 15 frames shown]
[im 1/21]
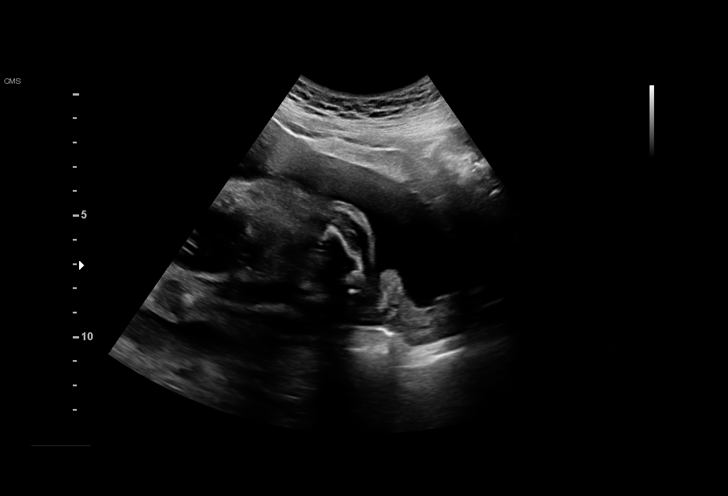
[im 3/21]
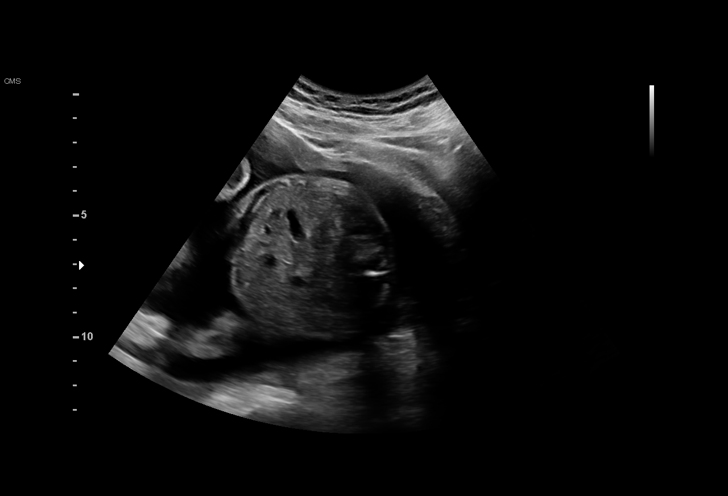
[im 4/21]
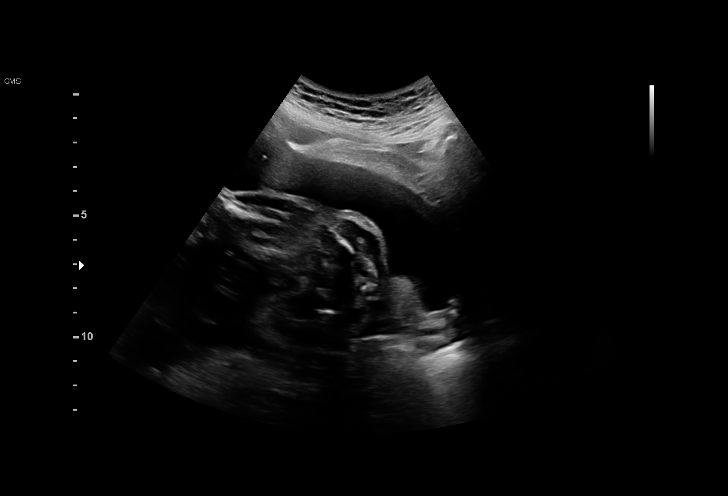
[im 5/21]
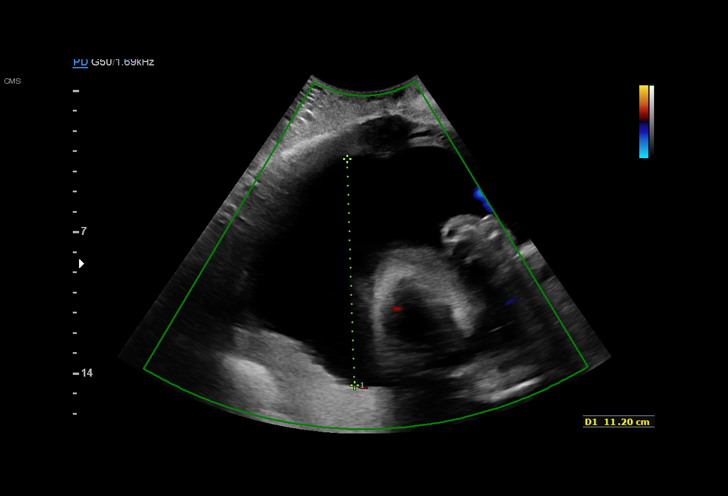
[im 7/21]
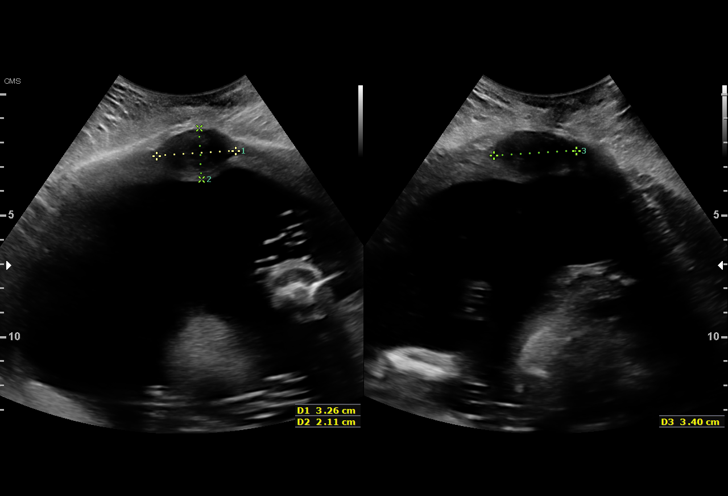
[im 8/21]
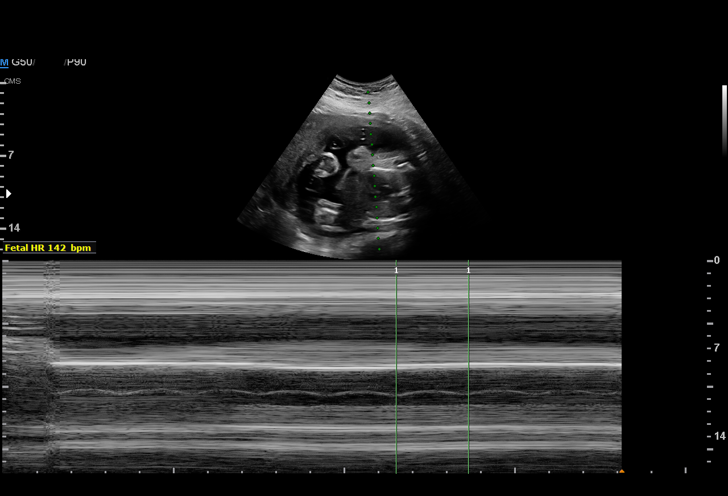
[im 10/21]
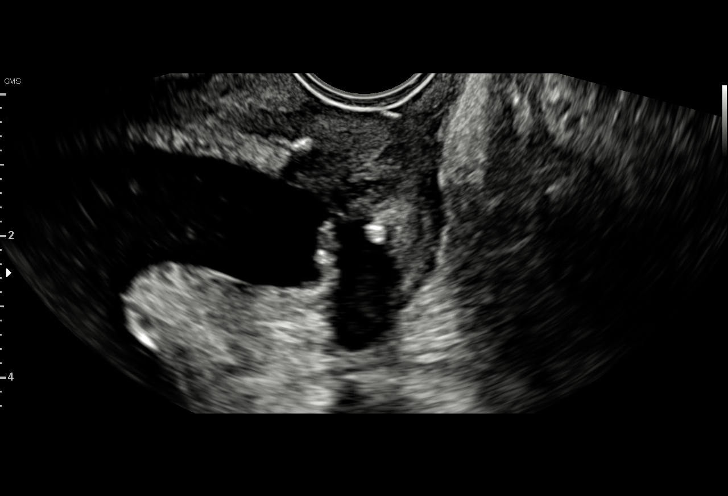
[im 11/21]
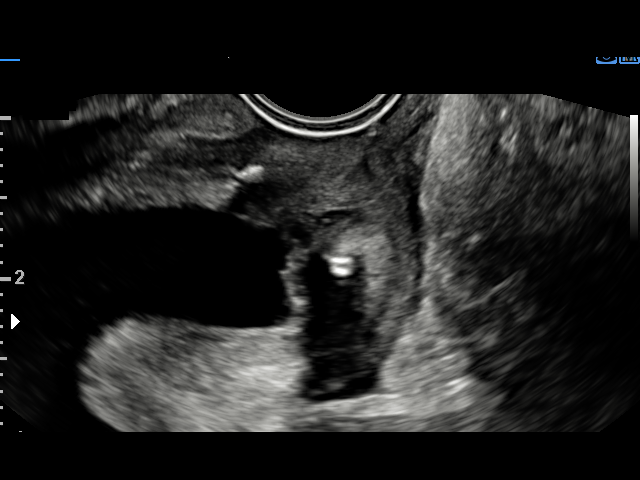
[im 12/21]
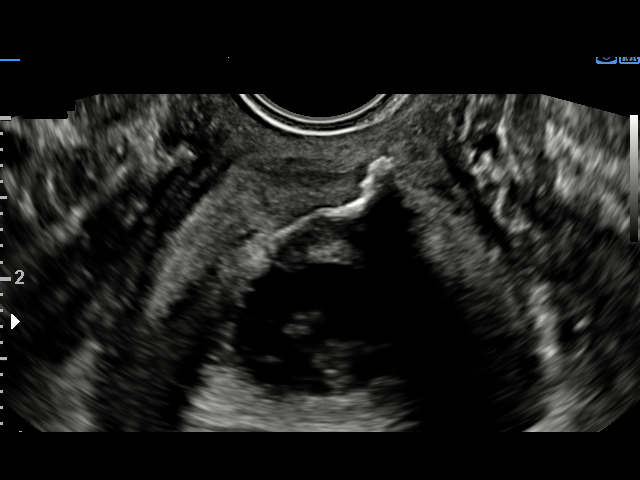
[im 14/21]
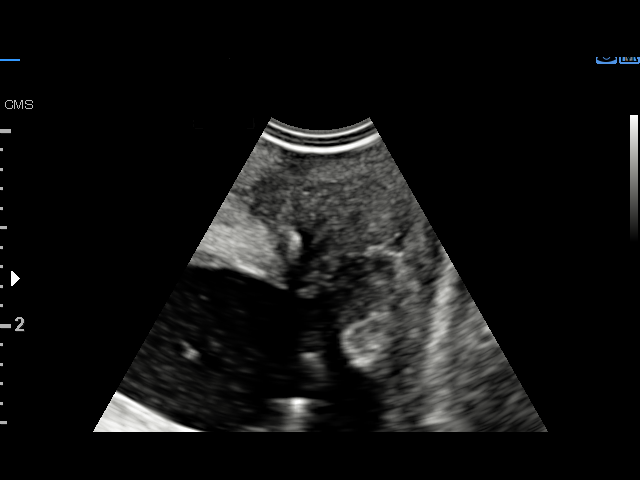
[im 15/21]
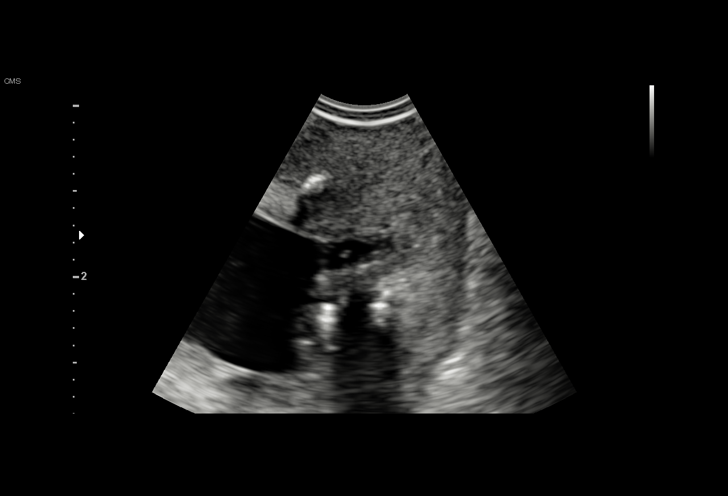
[im 17/21]
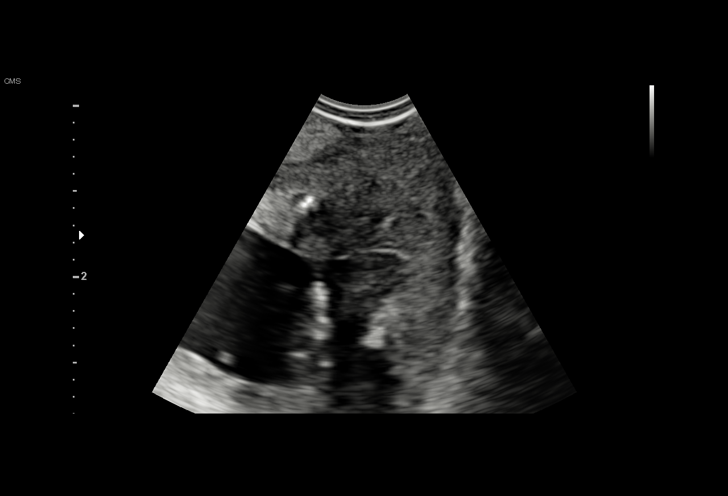
[im 18/21]
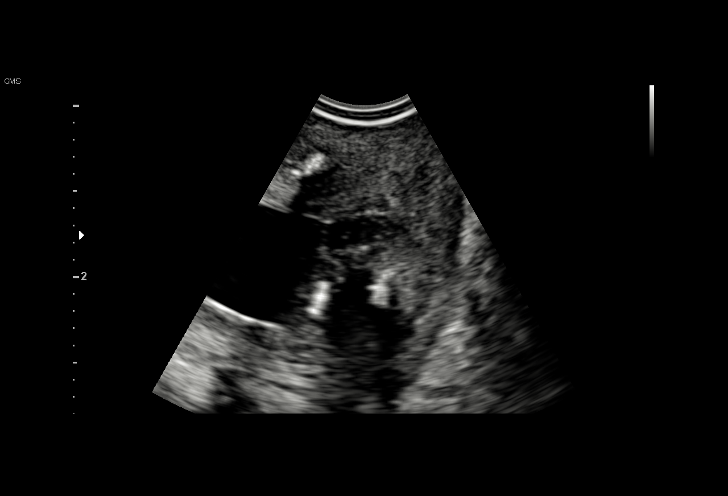
[im 19/21]
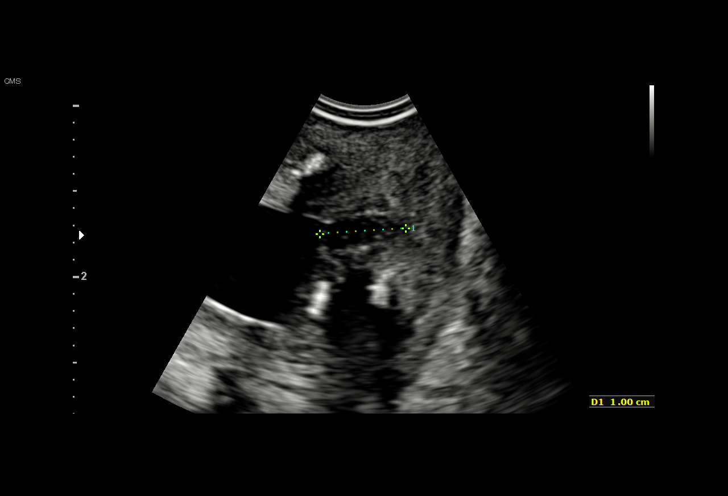
[im 21/21]
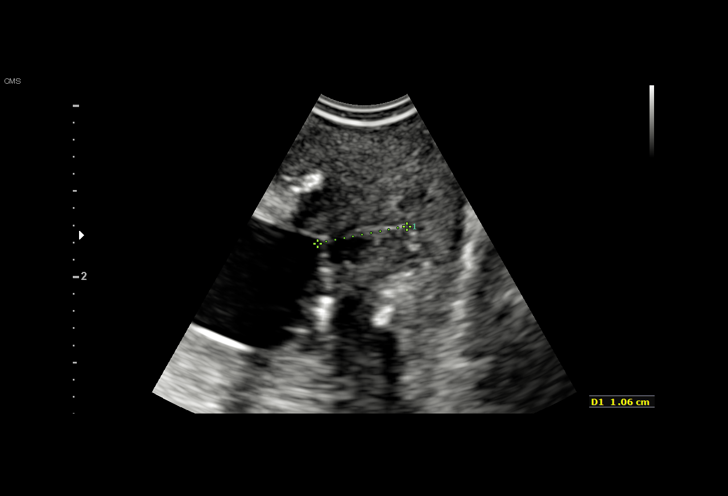

[15 of 21 positions shown; findings below may reference images not displayed]

1  SOSTHENES TEE            [id number]      [id number]     [id number]
Indications

25 weeks gestation of pregnancy
Cervical insufficiency, 2nd (s/p  rescue
cerclage)
Encounter for cervical length
OB History

Blood Type:            Height:  5'3"   Weight (lb):  252       BMI:
Gravidity:    1         Term:   0        Prem:   0        SAB:   0
TOP:          0       Ectopic:  0        Living: 0
Fetal Evaluation

Num Of Fetuses:     1
Fetal Heart         142
Rate(bpm):
Cardiac Activity:   Observed

Amniotic Fluid
AFI FV:      Polyhydramnios

Largest Pocket(cm)
11.1
Gestational Age

LMP:           24w 5d        Date:  12/07/16                 EDD:   09/13/17
Clinical EDD:  25w 6d                                        EDD:   09/05/17
Best:          25w 6d     Det. By:  Clinical EDD             EDD:   09/05/17
Cervix Uterus Adnexa
Cervix
Cerclage visualized. Funneling of internal os noted.
Comments

The cervix is funneled to the level of the cerclage.  The
remaining cervix measures 9 mm.  This is a notable change
since her last measurement on 05/05/17.  As she is
asymptomatic and has already received Nomas[place]ulele,
there is no pressing need for readmission at this time.
Although I did review warnings signs of rupture of
membranes or preterm labor that should prompt her to seek
further evaulation.  Ms. Alva Luz reports that that she has an
appointment in Dr. [REDACTED] tomorrow.  I would
recommend a speculum exam be performed at that visit to
make sure the cerclage remains in the proper place.
Impression

Single living intrauterine pregnancy at 25 weeks 6 days.
Polyhydramnios.
Transvaginal cervical length of 9 mm.
Funneling seen to the level of the cerclage.
Recommendations

Recommend follow-up examination in one week to reassess
cervical length. Ms. Goodrich reports that she has an ultrasound
scheduled next week in her primary OBs office.  If you desire
to have it instead performed at the [facility] please call to
schedule.

## 2023-04-10 ENCOUNTER — Encounter (HOSPITAL_COMMUNITY): Payer: Self-pay | Admitting: Internal Medicine

## 2023-04-10 ENCOUNTER — Observation Stay (HOSPITAL_COMMUNITY)
Admission: EM | Admit: 2023-04-10 | Discharge: 2023-04-11 | Disposition: A | Discharge status: Home / Self Care | Payer: BC Managed Care – PPO | Attending: Internal Medicine | Admitting: Internal Medicine

## 2023-04-10 ENCOUNTER — Emergency Department (HOSPITAL_COMMUNITY): Payer: BC Managed Care – PPO

## 2023-04-10 ENCOUNTER — Other Ambulatory Visit: Payer: Self-pay

## 2023-04-10 DIAGNOSIS — J1089 Influenza due to other identified influenza virus with other manifestations: Secondary | ICD-10-CM | POA: Insufficient documentation

## 2023-04-10 DIAGNOSIS — D649 Anemia, unspecified: Principal | ICD-10-CM | POA: Diagnosis present

## 2023-04-10 DIAGNOSIS — N920 Excessive and frequent menstruation with regular cycle: Secondary | ICD-10-CM | POA: Insufficient documentation

## 2023-04-10 DIAGNOSIS — D509 Iron deficiency anemia, unspecified: Secondary | ICD-10-CM | POA: Diagnosis not present

## 2023-04-10 DIAGNOSIS — Z6841 Body Mass Index (BMI) 40.0 and over, adult: Secondary | ICD-10-CM | POA: Diagnosis not present

## 2023-04-10 DIAGNOSIS — N939 Abnormal uterine and vaginal bleeding, unspecified: Secondary | ICD-10-CM | POA: Diagnosis not present

## 2023-04-10 DIAGNOSIS — E1165 Type 2 diabetes mellitus with hyperglycemia: Secondary | ICD-10-CM | POA: Diagnosis not present

## 2023-04-10 DIAGNOSIS — R0602 Shortness of breath: Secondary | ICD-10-CM | POA: Diagnosis not present

## 2023-04-10 DIAGNOSIS — J101 Influenza due to other identified influenza virus with other respiratory manifestations: Secondary | ICD-10-CM

## 2023-04-10 DIAGNOSIS — E876 Hypokalemia: Secondary | ICD-10-CM | POA: Diagnosis not present

## 2023-04-10 DIAGNOSIS — Z79899 Other long term (current) drug therapy: Secondary | ICD-10-CM | POA: Insufficient documentation

## 2023-04-10 DIAGNOSIS — E119 Type 2 diabetes mellitus without complications: Secondary | ICD-10-CM

## 2023-04-10 DIAGNOSIS — R079 Chest pain, unspecified: Secondary | ICD-10-CM | POA: Diagnosis present

## 2023-04-10 LAB — URINALYSIS, ROUTINE W REFLEX MICROSCOPIC
Glucose, UA: >=500 mg/dL — AB
Ketones, ur: NEGATIVE mg/dL
Nitrite: NEGATIVE
Protein, ur: 100 mg/dL — AB
Specific Gravity, Urine: >1.03 — ABNORMAL HIGH (ref 1.005–1.030)
pH: 6 (ref 5.0–8.0)

## 2023-04-10 LAB — BASIC METABOLIC PANEL
Anion gap: 10 (ref 5–15)
BUN: 9 mg/dL (ref 6–20)
CO2: 20 mmol/L — ABNORMAL LOW (ref 22–32)
Calcium: 9 mg/dL (ref 8.9–10.3)
Chloride: 102 mmol/L (ref 98–111)
Creatinine, Ser: 0.75 mg/dL (ref 0.44–1.00)
GFR, Estimated: >60 mL/min (ref >60)
Glucose, Bld: 240 mg/dL — ABNORMAL HIGH (ref 70–99)
Potassium: 3.8 mmol/L (ref 3.5–5.1)
Sodium: 132 mmol/L — ABNORMAL LOW (ref 135–145)

## 2023-04-10 LAB — HEMOGLOBIN A1C
Hgb A1c MFr Bld: 8 % — ABNORMAL HIGH (ref 4.8–5.6)
Mean Plasma Glucose: 182.9 mg/dL

## 2023-04-10 LAB — FERRITIN: Ferritin: 5 ng/mL — ABNORMAL LOW (ref 11–307)

## 2023-04-10 LAB — CBC
HCT: 20 % — ABNORMAL LOW (ref 36.0–46.0)
Hemoglobin: 5.3 g/dL — CL (ref 12.0–15.0)
MCH: 17.7 pg — ABNORMAL LOW (ref 26.0–34.0)
MCHC: 26.5 g/dL — ABNORMAL LOW (ref 30.0–36.0)
MCV: 66.7 fL — ABNORMAL LOW (ref 80.0–100.0)
Platelets: 277 10*3/uL (ref 150–400)
RBC: 3 MIL/uL — ABNORMAL LOW (ref 3.87–5.11)
RDW: 18.3 % — ABNORMAL HIGH (ref 11.5–15.5)
WBC: 7.1 10*3/uL (ref 4.0–10.5)
nRBC: 0.7 % — ABNORMAL HIGH (ref 0.0–0.2)

## 2023-04-10 LAB — RETICULOCYTES
Immature Retic Fract: 33.3 % — ABNORMAL HIGH (ref 2.3–15.9)
RBC.: 3.01 MIL/uL — ABNORMAL LOW (ref 3.87–5.11)
Retic Count, Absolute: 47.9 10*3/uL (ref 19.0–186.0)
Retic Ct Pct: 1.6 % (ref 0.4–3.1)

## 2023-04-10 LAB — URINALYSIS, MICROSCOPIC (REFLEX): RBC / HPF: >50 RBC/hpf (ref 0–5)

## 2023-04-10 LAB — FOLATE: Folate: 15.4 ng/mL (ref >5.9)

## 2023-04-10 LAB — TROPONIN I (HIGH SENSITIVITY)
Troponin I (High Sensitivity): <2 ng/L (ref <18)
Troponin I (High Sensitivity): <2 ng/L (ref <18)

## 2023-04-10 LAB — PREPARE RBC (CROSSMATCH)

## 2023-04-10 LAB — GLUCOSE, CAPILLARY: Glucose-Capillary: 178 mg/dL — ABNORMAL HIGH (ref 70–99)

## 2023-04-10 LAB — IRON AND TIBC
Iron: 16 ug/dL — ABNORMAL LOW (ref 28–170)
Saturation Ratios: 3 % — ABNORMAL LOW (ref 10.4–31.8)
TIBC: 547 ug/dL — ABNORMAL HIGH (ref 250–450)
UIBC: 531 ug/dL

## 2023-04-10 LAB — HCG, SERUM, QUALITATIVE: Preg, Serum: NEGATIVE

## 2023-04-10 LAB — VITAMIN B12: Vitamin B-12: 360 pg/mL (ref 180–914)

## 2023-04-10 MED ORDER — POLYETHYLENE GLYCOL 3350 17 G PO PACK: 17.0000 g | PACK | Freq: Every day | ORAL | Status: DC | PRN

## 2023-04-10 MED ORDER — BISACODYL 5 MG PO TBEC: 5.0000 mg | DELAYED_RELEASE_TABLET | Freq: Every day | ORAL | Status: DC | PRN

## 2023-04-10 MED ORDER — INSULIN ASPART 100 UNIT/ML IJ SOLN: 0.0000 [IU] | Freq: Every day | INTRAMUSCULAR | Status: DC

## 2023-04-10 MED ORDER — INSULIN ASPART 100 UNIT/ML IJ SOLN: 0.0000 [IU] | Freq: Three times a day (TID) | INTRAMUSCULAR | Status: DC

## 2023-04-10 MED ORDER — SODIUM CHLORIDE 0.9 % IV BOLUS
500.0000 mL | Freq: Once | INTRAVENOUS | Status: AC
  Administered 2023-04-10: 500 mL via INTRAVENOUS

## 2023-04-10 MED ORDER — BENZONATATE 100 MG PO CAPS
100.0000 mg | ORAL_CAPSULE | Freq: Three times a day (TID) | ORAL | Status: DC
  Administered 2023-04-10 – 2023-04-11 (×2): 100 mg via ORAL
  Filled 2023-04-10 (×2): qty 1

## 2023-04-10 MED ORDER — SODIUM CHLORIDE 0.9% IV SOLUTION: Freq: Once | INTRAVENOUS | Status: AC

## 2023-04-10 MED ORDER — HYDRALAZINE HCL 20 MG/ML IJ SOLN: 10.0000 mg | Freq: Four times a day (QID) | INTRAMUSCULAR | Status: DC | PRN

## 2023-04-10 MED ORDER — ALBUTEROL SULFATE (2.5 MG/3ML) 0.083% IN NEBU: 2.5000 mg | INHALATION_SOLUTION | Freq: Four times a day (QID) | RESPIRATORY_TRACT | Status: DC | PRN

## 2023-04-10 MED ORDER — MEGESTROL ACETATE 40 MG PO TABS
40.0000 mg | ORAL_TABLET | Freq: Two times a day (BID) | ORAL | Status: DC
  Administered 2023-04-10 – 2023-04-11 (×2): 40 mg via ORAL
  Filled 2023-04-10 (×3): qty 1

## 2023-04-10 MED ORDER — ACETAMINOPHEN 325 MG PO TABS: 650.0000 mg | ORAL_TABLET | Freq: Four times a day (QID) | ORAL | Status: DC | PRN

## 2023-04-10 MED ORDER — OSELTAMIVIR PHOSPHATE 75 MG PO CAPS
75.0000 mg | ORAL_CAPSULE | Freq: Two times a day (BID) | ORAL | Status: DC
  Administered 2023-04-10 – 2023-04-11 (×2): 75 mg via ORAL
  Filled 2023-04-10 (×3): qty 1

## 2023-04-10 MED ORDER — ACETAMINOPHEN 650 MG RE SUPP: 650.0000 mg | Freq: Four times a day (QID) | RECTAL | Status: DC | PRN

## 2023-04-10 MED ORDER — FERROUS SULFATE 325 (65 FE) MG PO TABS
325.0000 mg | ORAL_TABLET | Freq: Every day | ORAL | Status: DC
  Administered 2023-04-11: 325 mg via ORAL
  Filled 2023-04-10: qty 1

## 2023-04-10 NOTE — Consult Note (Signed)
   OB/GYN Telephone Consult  Meghan Howard is a 38 y.o. G1P0101 presenting with AUB - bleeding for 2 years. Patient has an appointment in March with a gynecologist but it is someone new to her and she is unsure their name.   I was called for a consult regarding the care of this patient by [facility].    The provider had a clinical question regarding management of her bleeding.    The provider presented the following relevant clinical information and I performed a chart review on the patient and reviewed available documentation:  HgB is 5.3   I reviewed her pelvic ultrasound images independently and my findings are: Normal pelvic ultrasound. EL is 5mm. No anatomic cause for bleeding.   BP 131/80   Pulse 87   Temp 98.9 F (37.2 C) (Oral)   Resp 19   Ht 5\' 3"  (1.6 m)   Wt 108.9 kg   LMP 04/07/2023   SpO2 100%   BMI 42.51 kg/m   Exam- performed by consulting provider   Recommendations:  - Agree with transfusion.  - Recommend Megace 40mg  BID. May titrate to 80 mg BID if insufficient response but would give at least 2 weeks for full response.  - Recommend outpatient follow up in March for EMB/pap smear.   Thank you for this consult and if additional recommendations are needed please call [phone] for the OB/GYN attending on service at [facility].   I spent approximately 5 minutes directly consulting with the provider and verbally discussing this case. Additionally 10 minutes minutes was spent performing chart review and documentation.   Milas Hock, MD Attending Obstetrician & Gynecologist, [facility] for [facility], [facility]

## 2023-04-10 NOTE — ED Triage Notes (Addendum)
EMS stated, Tested positive for the flu at [facility], Had some dizziness and syncope at [facility] with a cough and chest pain from the cough. 18 g left AC given 500cc NS

## 2023-04-10 NOTE — ED Provider Notes (Signed)
[place] EMERGENCY DEPARTMENT AT [facility] Provider Note   CSN: [id number] Arrival date & time: 04/10/23  1033     History  Chief Complaint  Patient presents with   Near Syncope   Cough   Chest Pain   Influenza    Meghan Howard is a 38 y.o. female.  Meghan Howard is a 38 y.o. female with a history of A-fib and diabetes, who presents to the emergency department from urgent care for further evaluation of chest pain, syncope and URI symptoms.  Patient reports that she has been feeling poorly for the past 3 days, seen at urgent care today and tested positive for influenza A.  While at urgent care had some dizziness and a witnessed syncopal episode, did not fall to the ground or suffer any injury from this.  Patient also reports that she has had some chest pains over the past few days which she primarily had associated with cough.  Laboratory evaluation from triage revealed a hemoglobin of 5.3.  Patient denies known history of anemia.  She has not noted any melena or blood in her stools.  She does report that over the past 2 years she has had heavy irregular menstrual bleeding and says that she bleeds most days out of the month and some of those days she passes clots.  She is currently on her menstrual cycle.  She has an appointment next month to have this abnormal bleeding evaluated by an OB/GYN but has not followed with anyone regarding this prior and has not had labs checked  The history is provided by the patient and a parent.  Near Syncope Associated symptoms include chest pain. Pertinent negatives include no abdominal pain.  Cough Associated symptoms: chest pain and chills   Associated symptoms: no fever   Chest Pain Associated symptoms: cough, fatigue and near-syncope   Associated symptoms: no abdominal pain, no fever, no nausea and no vomiting   Influenza Presenting symptoms: cough and fatigue   Presenting symptoms: no fever, no nausea and no vomiting   Associated  symptoms: chills        Home Medications Prior to Admission medications   Medication Sig Start Date End Date Taking? Authorizing Provider  ibuprofen (ADVIL) 200 MG tablet Take 400 mg by mouth daily as needed for headache.   Yes [provider]  naproxen sodium (ALEVE) 220 MG tablet Take 440 mg by mouth daily as needed (pain).   Yes [provider]      Allergies    Penicillins    Review of Systems   Review of Systems  Constitutional:  Positive for chills and fatigue. Negative for fever.  Respiratory:  Positive for cough.   Cardiovascular:  Positive for chest pain and near-syncope.  Gastrointestinal:  Negative for abdominal pain, nausea and vomiting.  Genitourinary:  Positive for vaginal bleeding. Negative for dysuria, frequency, pelvic pain and vaginal discharge.  Neurological:  Positive for syncope and light-headedness.  All other systems reviewed and are negative.   Physical Exam Updated Vital Signs BP 116/74   Pulse 88   Temp 99.5 F (37.5 C)   Resp 20   Ht 5\' 3"  (1.6 m)   Wt 108.9 kg   LMP 04/07/2023   SpO2 100%   BMI 42.51 kg/m  Physical Exam Vitals and nursing note reviewed.  Constitutional:      General: She is not in acute distress.    Appearance: Normal appearance. She is well-developed. She is not diaphoretic.  HENT:  Head: Normocephalic and atraumatic.  Eyes:     General:        Right eye: No discharge.        Left eye: No discharge.     Pupils: Pupils are equal, round, and reactive to light.     Comments: Conjunctival pallor  Cardiovascular:     Rate and Rhythm: Normal rate and regular rhythm.     Pulses: Normal pulses.     Heart sounds: Normal heart sounds.  Pulmonary:     Effort: Pulmonary effort is normal. No respiratory distress.     Breath sounds: Normal breath sounds. No wheezing or rales.     Comments: Respirations equal and unlabored, patient able to speak in full sentences, lungs clear to auscultation bilaterally   Abdominal:     General: Bowel sounds are normal. There is no distension.     Palpations: Abdomen is soft. There is no mass.     Tenderness: There is no abdominal tenderness. There is no guarding.     Comments: Abdomen soft, nondistended, nontender to palpation in all quadrants without guarding or peritoneal signs  Genitourinary:    Comments: Patient declined rectal and pelvic exams Musculoskeletal:        General: No deformity.     Cervical back: Neck supple.  Skin:    General: Skin is warm and dry.     Capillary Refill: Capillary refill takes less than 2 seconds.  Neurological:     Mental Status: She is alert and oriented to person, place, and time.     Coordination: Coordination normal.     Comments: Speech is clear, able to follow commands Moves extremities without ataxia, coordination intact  Psychiatric:        Mood and Affect: Mood normal.        Behavior: Behavior normal.     ED Results / Procedures / Treatments   Labs (all labs ordered are listed, but only abnormal results are displayed) Labs Reviewed  BASIC METABOLIC PANEL - Abnormal; Notable for the following components:      Result Value   Sodium 132 (*)    CO2 20 (*)    Glucose, Bld 240 (*)    All other components within normal limits  CBC - Abnormal; Notable for the following components:   RBC 3.00 (*)    Hemoglobin 5.3 (*)    HCT 20.0 (*)    MCV 66.7 (*)    MCH 17.7 (*)    MCHC 26.5 (*)    RDW 18.3 (*)    nRBC 0.7 (*)    All other components within normal limits  URINALYSIS, ROUTINE W REFLEX MICROSCOPIC - Abnormal; Notable for the following components:   Color, Urine AMBER (*)    APPearance TURBID (*)    Specific Gravity, Urine >1.030 (*)    Glucose, UA >=500 (*)    Hgb urine dipstick LARGE (*)    Bilirubin Urine SMALL (*)    Protein, ur 100 (*)    Leukocytes,Ua TRACE (*)    All other components within normal limits  URINALYSIS, MICROSCOPIC (REFLEX) - Abnormal; Notable for the following  components:   Bacteria, UA FEW (*)    All other components within normal limits  IRON AND TIBC - Abnormal; Notable for the following components:   Iron 16 (*)    TIBC 547 (*)    Saturation Ratios 3 (*)    All other components within normal limits  FERRITIN - Abnormal; Notable for the following  components:   Ferritin 5 (*)    All other components within normal limits  RETICULOCYTES - Abnormal; Notable for the following components:   RBC. 3.01 (*)    Immature Retic Fract 33.3 (*)    All other components within normal limits  HCG, SERUM, QUALITATIVE  VITAMIN B12  FOLATE  CBG MONITORING, ED  TYPE AND SCREEN  PREPARE RBC (CROSSMATCH)  TROPONIN I (HIGH SENSITIVITY)  TROPONIN I (HIGH SENSITIVITY)    EKG EKG Interpretation Date/Time:  Thursday April 10 2023 10:55:36 EST Ventricular Rate:  104 PR Interval:  126 QRS Duration:  78 QT Interval:  358 QTC Calculation: 470 R Axis:   -22  Text Interpretation: Sinus tachycardia Possible Anterolateral infarct , age undetermined Abnormal ECG When compared with ECG of 26-Feb-2018 11:53, PREVIOUS ECG IS PRESENTrate mildly increased Confirmed by Benjiman Core [phone]) on 04/10/2023 10:58:16 AM  Radiology DG Chest 2 View Result Date: 04/10/2023 CLINICAL DATA:  Syncope, flu positive EXAM: CHEST - 2 VIEW COMPARISON:  02/26/2018 FINDINGS: Stable cardiac enlargement. Minor basilar atelectasis. Negative for edema, pneumonia, collapse or consolidation. No large effusion or pneumothorax. Trachea midline. IMPRESSION: Cardiomegaly and minor basilar atelectasis. Electronically Signed   By: Judie Petit.  Shick M.D.   On: 04/10/2023 13:09    Procedures .Critical Care  Performed by: Rosezella Rumpf, PA-C Authorized by: Rosezella Rumpf, PA-C   Critical care provider statement:    Critical care time (minutes):  30   Critical care was necessary to treat or prevent imminent or life-threatening deterioration of the following conditions:  Circulatory failure  (Symptomatic anemia requiring transfusion)   Critical care was time spent personally by me on the following activities:  Development of treatment plan with patient or surrogate, discussions with consultants, evaluation of patient's response to treatment, examination of patient, ordering and review of laboratory studies, ordering and review of radiographic studies, ordering and performing treatments and interventions, pulse oximetry, re-evaluation of patient's condition and review of old charts     Medications Ordered in ED Medications  sodium chloride 0.9 % bolus 500 mL (0 mLs Intravenous Stopped 04/10/23 1516)  0.9 %  sodium chloride infusion (Manually program via Guardrails IV Fluids) (0 mLs Intravenous Stopped 04/10/23 1516)    ED Course/ Medical Decision Making/ A&P                               Medical Decision Making Amount and/or Complexity of Data Reviewed Labs: ordered. Decision-making details documented in ED Course. Radiology: ordered.  Risk Prescription drug management. Decision regarding hospitalization.  38 year old female presents to the ED from urgent care after episode of chest pain and syncopal episode.  Patient was seen at urgent care for URI symptoms and diagnosed with influenza A.  Does have an underlying history of A-fib.  Screening labs ordered from triage which shows significant anemia of 5.3.  Hemoglobin from 5 years ago was 13.0, prior to that did have some mild anemia.  Patient reports she has not been aware of a diagnosis of anemia and has never required blood transfusion.  She denies any melena or hematochezia but has had heavy menstrual bleeding persistently for the past 2 years that has not been evaluated.  She is currently on her menstrual cycle and having bleeding but denies lower abdominal or pelvic pain.  Given that there is another clear explanation for anemia with persistent vaginal bleeding feel GI bleeding is less likely and patient would prefer  to  defer rectal exam which I feel is reasonable.  Labs otherwise reassuring without significant electrolyte derangements, urinalysis with blood present as to be expected but without significant signs of infection.  Anemia panel and type and screen added to lab work.  Because of patient's initial reports of chest pain at urgent care troponins were ordered and negative as well as EKG, which showed normal sinus rhythm.  Chest x-ray ordered and interpreted independently without evidence of active cardiopulmonary disease.  Given ongoing vaginal bleeding that has not been evaluated pelvic ultrasound ordered to further assess for etiology of bleeding.  2 units PRBCs ordered.  At shift change care signed out to PA [facility] out who will follow-up on results from ultrasound and discussed with gynecology for recommendations surrounding bleeding, patient will require admission for transfusion and further treatment.         Final Clinical Impression(s) / ED Diagnoses Final diagnoses:  Symptomatic anemia  Abnormal uterine bleeding  Influenza A    Rx / DC Orders ED Discharge Orders     None         Velda Shell 04/10/23 1533    Alvira Monday, MD 04/11/23 2235

## 2023-04-10 NOTE — H&P (Signed)
[facility] History and Physical  Meghan Howard ZOX:[id number] DOB: [birth year] DOA: 04/10/2023 PCP: Pcp, No  Presented from: home Chief Complaint: Shortness of breath, weakness, dizziness  History of Present Illness: Meghan Howard is a 38 y.o. female with PMH significant for DM2 not on meds, chronic menorrhagia. She was seen at an urgent care center earlier in the day for 3 days history of upper respiratory symptoms.  She tested positive for influenza A.  Tamiflu was called in and she was about to be discharged.  However, she got dizzy and reportedly had a syncopal episode.  EMS was called and patient was sent to the ED.  In the ED, patient had temperature of 98.5, blood pressure in low normal range, heart rate in 80s. Labs showed WC count of 7.1, hemoglobin 5.3, MCV 67, sodium 132, glucose 240 Ferritin level significantly low at 5 Urinalysis showed turbid amber color urine with large hemoglobin, few bacteria EKG with sinus tachycardia at 104 bpm, QTc 470 ms Pelvic ultrasound unremarkable  2 units of PRBC transfusion was ordered. EDP discussed with GYN on-call Hospitalist service consulted for overnight observation  At the time of my evaluation, patient was lying on bed.  Somnolent, opens eyes to command.  Feels weak but better than before. No family at bedside History reviewed and detailed as above.  Review of Systems:  All systems were reviewed and were negative unless otherwise mentioned in the HPI   Past medical history: Past Medical History:  Diagnosis Date   Diabetes mellitus without complication (HCC)    Gestational Diabetes    Past surgical history: Past Surgical History:  Procedure Laterality Date   CERVICAL CERCLAGE N/A 05/02/2017   Procedure: CERCLAGE CERVICAL;  Surgeon: Silverio Lay, MD;  Location: [facility];  Service: Gynecology;  Laterality: N/A;    Social History:  reports that she has never smoked. She has never used smokeless tobacco. She reports that  she does not drink alcohol and does not use drugs.  Allergies:  Allergies  Allergen Reactions   Penicillins Hives    Has patient had a PCN reaction causing immediate rash, facial/tongue/throat swelling, SOB or lightheadedness with hypotension: Yes Has patient had a PCN reaction causing severe rash involving mucus membranes or skin necrosis: Yes Has patient had a PCN reaction that required hospitalization: Yes Has patient had a PCN reaction occurring within the last 10 years: No If all of the above answers are "NO", then may proceed with Cephalosporin use.    Penicillins   Family history:  Family History  Problem Relation Age of Onset   Diabetes Mother    Hypertension Mother    Diabetes Father    Hypertension Father    Diabetes Maternal Grandmother      Physical Exam: Vitals:   04/10/23 1546 04/10/23 1605 04/10/23 1746 04/10/23 1802  BP: 129/77 131/80 131/86 124/77  Pulse: 84 87 89 83  Resp: 19 19 19  (!) 21  Temp: 99.3 F (37.4 C) 98.9 F (37.2 C) 99.1 F (37.3 C) 98.6 F (37 C)  TempSrc: Oral Oral Oral Oral  SpO2: 100% 100%  100%  Weight:      Height:       Wt Readings from Last 3 Encounters:  04/10/23 108.9 kg  02/26/18 117.9 kg  07/15/17 119.7 kg   Body mass index is 42.51 kg/m.  General exam: Pleasant, young morbidly obese African-American female Skin: No rashes, lesions or ulcers. HEENT: Atraumatic, normocephalic, no obvious bleeding.  Hirsutism Lungs: Clear to auscultation bilaterally,  CVS: S1, S2, no murmur,   GI/Abd: Soft, nontender, distended from obesity, bowel sound present,   CNS: Alert, awake, oriented x 3 Psychiatry: Mood appropriate,  Extremities: No pedal edema, no calf tenderness,     ----------------------------------------------------------------------------------------------------------------------------------------- ----------------------------------------------------------------------------------------------------------------------------------------- -----------------------------------------------------------------------------------------------------------------------------------------  Assessment/Plan: Principal Problem:   Severe anemia  Influenza A Seen at urgent care center for URI symptoms, weakness Tested positive for influenza A.  Was planned for Tamiflu. Start Tamiflu 75 mg twice daily Not on supplemental oxygen. Continue supportive care with antitussives  Acute severe anemia Chronic menorrhagia Severe iron deficiency Had syncopal/near syncopal episode at the urgent care.  Likely due to weakness from flu in the setting of severe anemia. Hemoglobin noted to be severely low at 5.3 Patient reportedly has chronic menorrhagia for last 2 years.  She is due to see a GYN on March. 2 units of PRBC transfusion ordered. Ferritin level significantly low at 5.  Start oral iron supplement GYN consultation noted. Recommended Megace 40 mg twice daily.  If insufficient response in 2 weeks, to titrate up to 80 mg twice daily. Recommended outpatient follow-up for endometrial biopsy/Pap smear Recent Labs    04/10/23 1055 04/10/23 1321 04/10/23 1322  HGB 5.3*  --   --   MCV 66.7*  --   --   VITAMINB12  --  360  --   FOLATE  --  15.4  --   FERRITIN  --   --  5*  TIBC  --   --  547*  IRON  --   --  16*  RETICCTPCT  --   --  1.6   Type 2 diabetes mellitus with hyperglycemia A1c ordered Blood sugar level elevated over 200 on blood work this morning PTA meds-none Start SSI/Accu-Cheks May benefit from some oral agents at least at discharge. No results found for: "HGBA1C" No results for input(s): "GLUCAP" in the last 168 hours.  Morbid Obesity  Body mass  index is 42.51 kg/m. Patient has been advised to make an attempt to improve diet and exercise patterns to aid in weight loss.   Mobility: Encourage ambulation  Goals of care:   Code Status: Full Code    DVT prophylaxis:  SCDs Start: 04/10/23 1740   Antimicrobials: Tamiflu Fluid: None Consultants: GYN Family Communication: None at bedside  Dispo: The patient is from: Home              Anticipated d/c is to: Hopefully home tomorrow  Diet: Diet Order             Diet Carb Modified Fluid consistency: Thin; Room service appropriate? Yes  Diet effective now                    ------------------------------------------------------------------------------------- Severity of Illness: The appropriate patient status for this patient is OBSERVATION. Observation status is judged to be reasonable and necessary in order to provide the required intensity of service to ensure the patient's safety. The patient's presenting symptoms, physical exam findings, and initial radiographic and laboratory data in the context of their medical condition is felt to place them at decreased risk for further clinical deterioration. Furthermore, it is anticipated that the patient will be medically stable for discharge from the hospital within 2 midnights of admission.  -------------------------------------------------------------------------------------   Home Meds: Prior to Admission medications   Medication Sig Start Date End Date Taking? Authorizing Provider  ibuprofen (ADVIL) 200 MG tablet Take 400 mg by mouth daily as needed for headache.   Yes [provider]  naproxen sodium (ALEVE) 220 MG tablet Take 440 mg by mouth daily as needed (pain).   Yes [provider]    Labs on Admission:   CBC: Recent Labs  Lab 04/10/23 1055  WBC 7.1  HGB 5.3*  HCT 20.0*  MCV 66.7*  PLT 277    Basic Metabolic Panel: Recent Labs  Lab 04/10/23 1055  NA 132*  K 3.8  CL 102  CO2 20*   GLUCOSE 240*  BUN 9  CREATININE 0.75  CALCIUM 9.0    Liver Function Tests: No results for input(s): "AST", "ALT", "ALKPHOS", "BILITOT", "PROT", "ALBUMIN" in the last 168 hours. No results for input(s): "LIPASE", "AMYLASE" in the last 168 hours. No results for input(s): "AMMONIA" in the last 168 hours.  Cardiac Enzymes: No results for input(s): "CKTOTAL", "CKMB", "CKMBINDEX", "TROPONINI" in the last 168 hours.  BNP (last 3 results) No results for input(s): "BNP" in the last 8760 hours.  ProBNP (last 3 results) No results for input(s): "PROBNP" in the last 8760 hours.  CBG: No results for input(s): "GLUCAP" in the last 168 hours.  Lipase  No results found for: "LIPASE"   Urinalysis    Component Value Date/Time   COLORURINE AMBER (A) 04/10/2023 1055   APPEARANCEUR TURBID (A) 04/10/2023 1055   LABSPEC >1.030 (H) 04/10/2023 1055   PHURINE 6.0 04/10/2023 1055   GLUCOSEU >=500 (A) 04/10/2023 1055   HGBUR LARGE (A) 04/10/2023 1055   BILIRUBINUR SMALL (A) 04/10/2023 1055   KETONESUR NEGATIVE 04/10/2023 1055   PROTEINUR 100 (A) 04/10/2023 1055   NITRITE NEGATIVE 04/10/2023 1055   LEUKOCYTESUR TRACE (A) 04/10/2023 1055     Drugs of Abuse  No results found for: "LABOPIA", "COCAINSCRNUR", "LABBENZ", "AMPHETMU", "THCU", "LABBARB"    Radiological Exams on Admission: US PELVIC COMPLETE WITH TRANSVAGINAL Result Date: 04/10/2023 CLINICAL DATA:  Vaginal bleeding. EXAM: TRANSABDOMINAL AND TRANSVAGINAL ULTRASOUND OF PELVIS TECHNIQUE: Both transabdominal and transvaginal ultrasound examinations of the pelvis were performed. Transabdominal technique was performed for global imaging of the pelvis including uterus, ovaries, adnexal regions, and pelvic cul-de-sac. It was necessary to proceed with endovaginal exam following the transabdominal exam to visualize the endometrium and ovaries. COMPARISON:  None Available. FINDINGS: Uterus Measurements: 11.2 x 5.0 x 5.9 cm = volume: 172 mL. The  uterus is anteverted and appears unremarkable. Endometrium Thickness: 11 mm.  No focal abnormality visualized. Right ovary The right ovary is not visualized. Left ovary Measurements: 3.0 x 2.4 x 2.4 cm = volume: 9.4 mL. Normal appearance/no adnexal mass. Other findings No abnormal free fluid. IMPRESSION: Nonvisualization of the right ovary, otherwise unremarkable pelvic ultrasound. Electronically Signed   By: Elgie Collard M.D.   On: 04/10/2023 16:39   DG Chest 2 View Result Date: 04/10/2023 CLINICAL DATA:  Syncope, flu positive EXAM: CHEST - 2 VIEW COMPARISON:  02/26/2018 FINDINGS: Stable cardiac enlargement. Minor basilar atelectasis. Negative for edema, pneumonia, collapse or consolidation. No large effusion or pneumothorax. Trachea midline. IMPRESSION: Cardiomegaly and minor basilar atelectasis. Electronically Signed   By: Judie Petit.  Shick M.D.   On: 04/10/2023 13:09     Signed, Lorin Glass, MD [facility] 04/10/2023

## 2023-04-10 NOTE — ED Provider Triage Note (Signed)
Emergency Medicine Provider Triage Evaluation Note  Meghan Howard , a 38 y.o. female  was evaluated in triage.  Pt complains of URI symptoms and near syncope.  Was at urgent care.  Felt more short of breath..   Physical Exam  BP 108/72 (BP Location: Right Arm)   Pulse 94   Temp 99.5 F (37.5 C)   Resp 18   Ht 5\' 3"  (1.6 m)   Wt 108.9 kg   LMP 04/07/2023   SpO2 100%   BMI 42.51 kg/m  Normal heart rate.  Well-appearing.  Medical Decision Making  Medically screening exam initiated at 10:56 AM.  Appropriate orders placed.  Meghan Howard was informed that the remainder of the evaluation will be completed by another provider, this initial triage assessment does not replace that evaluation, and the importance of remaining in the ED until their evaluation is complete.  Shortness of breath with positive flu test.  History of A-fib but not in A-fib now.  Likely secondary to the flu but will check basic testing.   Benjiman Core, MD 04/10/23 1057

## 2023-04-10 NOTE — ED Provider Notes (Addendum)
Patient with daily bleeding for 2 years per her account.  She is being transfused blood.  Will admit to hospitalist service will touch base with OB/GYN once ultrasound returns.   Physical Exam  BP 131/80   Pulse 87   Temp 98.9 F (37.2 C) (Oral)   Resp 19   Ht 5\' 3"  (1.6 m)   Wt 108.9 kg   LMP 04/07/2023   SpO2 100%   BMI 42.51 kg/m   Physical Exam  Procedures  .Critical Care  Performed by: Gailen Shelter, PA Authorized by: Gailen Shelter, PA   Critical care provider statement:    Critical care time (minutes):  31   Critical care time was exclusive of:  Separately billable procedures and treating other patients and teaching time   Critical care was necessary to treat or prevent imminent or life-threatening deterioration of the following conditions: bleeding requiring transfusion.   Critical care was time spent personally by me on the following activities:  Development of treatment plan with patient or surrogate, discussions with consultants, evaluation of patient's response to treatment, examination of patient, ordering and review of laboratory studies, ordering and review of radiographic studies, ordering and performing treatments and interventions, pulse oximetry, re-evaluation of patient's condition and review of old charts   Care discussed with: admitting provider     ED Course / MDM    Medical Decision Making Amount and/or Complexity of Data Reviewed Labs: ordered. Radiology: ordered.  Risk Prescription drug management. Decision regarding hospitalization.    Discussed with Dr. Para March who agrees with hospitalist admission, will write recs -- did view [place] images.  Also agrees with transfusion.  Endometrial biopsy recommended for outpatient OB/GYN follow-up.   Admit to hospital service  Patient is currently being transfused 2 units of PRBCs.  She feels well no transfusion reaction symptoms.     Gailen Shelter, [place] 04/10/23 1659    Gailen Shelter,  [place] 04/10/23 1659    Virgina Norfolk, DO 04/10/23 2059

## 2023-04-10 NOTE — Plan of Care (Signed)
  Problem: Education: Goal: Ability to describe self-care measures that may prevent or decrease complications (Diabetes Survival Skills Education) will improve Outcome: Progressing   Problem: Education: Goal: Individualized Educational Video(s) Outcome: Progressing   Problem: Health Behavior/Discharge Planning: Goal: Ability to identify and utilize available resources and services will improve Outcome: Progressing

## 2023-04-11 DIAGNOSIS — E119 Type 2 diabetes mellitus without complications: Secondary | ICD-10-CM

## 2023-04-11 DIAGNOSIS — N921 Excessive and frequent menstruation with irregular cycle: Secondary | ICD-10-CM

## 2023-04-11 DIAGNOSIS — N920 Excessive and frequent menstruation with regular cycle: Secondary | ICD-10-CM | POA: Insufficient documentation

## 2023-04-11 DIAGNOSIS — J101 Influenza due to other identified influenza virus with other respiratory manifestations: Secondary | ICD-10-CM | POA: Diagnosis not present

## 2023-04-11 DIAGNOSIS — D649 Anemia, unspecified: Secondary | ICD-10-CM | POA: Diagnosis not present

## 2023-04-11 LAB — HEMOGLOBIN AND HEMATOCRIT, BLOOD
HCT: 28.2 % — ABNORMAL LOW (ref 36.0–46.0)
Hemoglobin: 8.1 g/dL — ABNORMAL LOW (ref 12.0–15.0)

## 2023-04-11 LAB — TYPE AND SCREEN
ABO/RH(D): O POS
Antibody Screen: NEGATIVE
Unit division: 0
Unit division: 0

## 2023-04-11 LAB — BASIC METABOLIC PANEL
Anion gap: 10 (ref 5–15)
BUN: 8 mg/dL (ref 6–20)
CO2: 22 mmol/L (ref 22–32)
Calcium: 9 mg/dL (ref 8.9–10.3)
Chloride: 103 mmol/L (ref 98–111)
Creatinine, Ser: 0.6 mg/dL (ref 0.44–1.00)
GFR, Estimated: >60 mL/min (ref >60)
Glucose, Bld: 181 mg/dL — ABNORMAL HIGH (ref 70–99)
Potassium: 3.2 mmol/L — ABNORMAL LOW (ref 3.5–5.1)
Sodium: 135 mmol/L (ref 135–145)

## 2023-04-11 LAB — GLUCOSE, CAPILLARY
Glucose-Capillary: 151 mg/dL — ABNORMAL HIGH (ref 70–99)
Glucose-Capillary: 241 mg/dL — ABNORMAL HIGH (ref 70–99)

## 2023-04-11 LAB — HIV ANTIBODY (ROUTINE TESTING W REFLEX): HIV Screen 4th Generation wRfx: NONREACTIVE

## 2023-04-11 LAB — BPAM RBC
Blood Product Expiration Date: 202502202359
Blood Product Expiration Date: 202503112359
ISSUE DATE / TIME: 202502131537
ISSUE DATE / TIME: 202502131742
Unit Type and Rh: 5100
Unit Type and Rh: 5100

## 2023-04-11 LAB — CBC
HCT: 23.9 % — ABNORMAL LOW (ref 36.0–46.0)
Hemoglobin: 7 g/dL — ABNORMAL LOW (ref 12.0–15.0)
MCH: 20.3 pg — ABNORMAL LOW (ref 26.0–34.0)
MCHC: 29.3 g/dL — ABNORMAL LOW (ref 30.0–36.0)
MCV: 69.3 fL — ABNORMAL LOW (ref 80.0–100.0)
Platelets: 222 10*3/uL (ref 150–400)
RBC: 3.45 MIL/uL — ABNORMAL LOW (ref 3.87–5.11)
RDW: 19.6 % — ABNORMAL HIGH (ref 11.5–15.5)
WBC: 5.4 10*3/uL (ref 4.0–10.5)
nRBC: 0.9 % — ABNORMAL HIGH (ref 0.0–0.2)

## 2023-04-11 MED ORDER — BLOOD GLUCOSE TEST VI STRP: 1.0000 | ORAL_STRIP | Freq: Three times a day (TID) | 0 refills | Status: AC

## 2023-04-11 MED ORDER — POTASSIUM CHLORIDE CRYS ER 20 MEQ PO TBCR: 20.0000 meq | EXTENDED_RELEASE_TABLET | Freq: Two times a day (BID) | ORAL | 0 refills | Status: AC

## 2023-04-11 MED ORDER — LANCETS MISC: 1.0000 | Freq: Three times a day (TID) | 0 refills | Status: AC

## 2023-04-11 MED ORDER — FERROUS SULFATE 325 (65 FE) MG PO TABS: 325.0000 mg | ORAL_TABLET | Freq: Every day | ORAL | 3 refills | Status: AC

## 2023-04-11 MED ORDER — LIVING WELL WITH DIABETES BOOK
Freq: Once | Status: DC
  Filled 2023-04-11: qty 1

## 2023-04-11 MED ORDER — GLIMEPIRIDE 2 MG PO TABS: 2.0000 mg | ORAL_TABLET | ORAL | 11 refills | Status: AC

## 2023-04-11 MED ORDER — OSELTAMIVIR PHOSPHATE 75 MG PO CAPS
75.0000 mg | ORAL_CAPSULE | Freq: Two times a day (BID) | ORAL | 0 refills | Status: AC
Start: 2023-04-11 — End: 2023-04-15

## 2023-04-11 MED ORDER — BLOOD GLUCOSE MONITORING SUPPL DEVI: 1.0000 | Freq: Three times a day (TID) | 0 refills | Status: AC

## 2023-04-11 MED ORDER — POTASSIUM CHLORIDE 20 MEQ PO PACK
40.0000 meq | PACK | Freq: Two times a day (BID) | ORAL | Status: DC
  Administered 2023-04-11: 40 meq via ORAL
  Filled 2023-04-11: qty 2

## 2023-04-11 MED ORDER — METFORMIN HCL 1000 MG PO TABS: 1000.0000 mg | ORAL_TABLET | Freq: Two times a day (BID) | ORAL | 2 refills | Status: AC

## 2023-04-11 NOTE — TOC Transition Note (Signed)
Transition of Care [facility]) - Discharge Note   Patient Details  Name: Meghan Howard MRN: [id number] Date of Birth: 1985-10-26  Transition of Care [facility]) CM/SW Contact:  Harriet Masson, RN Phone Number: 04/11/2023, 12:19 PM   Clinical Narrative:    Patient stable to discharge home and has a PCP with [facility].  No TOC needs at this time.    Final next level of care: Home/Self Care Barriers to Discharge: Barriers Resolved   Patient Goals and CMS Choice Patient states their goals for this hospitalization and ongoing recovery are:: return home          Discharge Placement               home        Discharge Plan and Services Additional resources added to the After Visit Summary for                                       Social Drivers of Health (SDOH) Interventions SDOH Screenings   Food Insecurity: No Food Insecurity (04/10/2023)  Housing: Low Risk  (04/10/2023)  Transportation Needs: No Transportation Needs (04/10/2023)  Utilities: Not At Risk (04/10/2023)  Tobacco Use: Low Risk  (04/10/2023)     Readmission Risk Interventions     No data to display

## 2023-04-11 NOTE — Discharge Summary (Signed)
 Physician Discharge Summary   Patient: Meghan Howard MRN: [id number] DOB: 1985/06/23  Admit date:     04/10/2023  Discharge date: 04/11/2023  Discharge Physician: Marcelino Duster   PCP: Pcp, No   Recommendations at discharge:    PCP follow up in 1 week for repeat CBC. Diabetes education provided. Ob/ Gyn follow up in 1 week  Discharge Diagnoses: Principal Problem:   Severe anemia Active Problems:   New onset type 2 diabetes mellitus (HCC)   Obesity, Class III, BMI 40-49.9 (morbid obesity) (HCC)   Influenza A   Menorrhagia  Resolved Problems:   * No resolved hospital problems. *  Hospital Course: Meghan Howard is a 38 y.o. female with PMH significant for DM2 not on meds, chronic menorrhagia. She was seen at an urgent care center earlier in the day for 3 days history of upper respiratory symptoms.  She tested positive for influenza A.  Tamiflu was called in and she was about to be discharged.  However, she got dizzy and reportedly had a syncopal episode.  EMS was called and patient was sent to the ED. Patient has multiple days of menstrual bleeding was noted to have hemoglobin of 5.3, admitted for further management evaluation of acute blood loss anemia.  Assessment and Plan: Severe anemia Acute blood loss due to menorrhagia Patient got 2 units of blood transfusion with hemoglobin rise appropriate to 8.1.  Patient is seen by OB/GYN who recommended outpatient follow-up. I advised her to follow-up with PCP for recheck of CBC next week. Patient's symptoms of dizziness much improved and she is hemodynamically stable to be discharged home.  Type 2 diabetes mellitus- A1c 8.0. New prescription for metformin, Amaryl sent to pharmacy. Patient's mother is diabetic and she knows how to check her blood sugars.  Glucometer with supplies sent to pharmacy.  Diabetes nurse educator and provided resources.  Hypokalemia- Potassium supplements for 10 days sent to pharmacy. Advised her to  follow-up with PCP for repeat BMP.  Influenza A- Advised to take Tamiflu for 5 days. Isolation precautions instructed.  Obesity with BMI 42.51- Advised weight reduction. Diet, exercise advised.       Consultants: OB/GYN Procedures performed: None Disposition: Home Diet recommendation:  Discharge Diet Orders (From admission, onward)     Start     Ordered   04/11/23 0000  Diet - low sodium heart healthy        04/11/23 1158   04/11/23 0000  Diet Carb Modified        04/11/23 1158           Cardiac and Carb modified diet DISCHARGE MEDICATION: Allergies as of 04/11/2023       Reactions   Penicillins Hives   Has patient had a PCN reaction causing immediate rash, facial/tongue/throat swelling, SOB or lightheadedness with hypotension: Yes Has patient had a PCN reaction causing severe rash involving mucus membranes or skin necrosis: Yes Has patient had a PCN reaction that required hospitalization: Yes Has patient had a PCN reaction occurring within the last 10 years: No If all of the above answers are "NO", then may proceed with Cephalosporin use.        Medication List     STOP taking these medications    ibuprofen 200 MG tablet Commonly known as: ADVIL   naproxen sodium 220 MG tablet Commonly known as: ALEVE       TAKE these medications    Blood Glucose Monitoring Suppl Devi 1 each by Does not apply route 3 (  three) times daily. May dispense any manufacturer covered by patient's insurance.   BLOOD GLUCOSE TEST STRIPS Strp 1 each by Does not apply route 3 (three) times daily. Use as directed to check blood sugar. May dispense any manufacturer covered by patient's insurance and fits patient's device.   ferrous sulfate 325 (65 FE) MG tablet Take 1 tablet (325 mg total) by mouth daily with breakfast. Start taking on: April 12, 2023   glimepiride 2 MG tablet Commonly known as: Amaryl Take 1 tablet (2 mg total) by mouth every morning.   Lancets Misc 1  each by Does not apply route 3 (three) times daily. Use as directed to check blood sugar. May dispense any manufacturer covered by patient's insurance and fits patient's device.   metFORMIN 1000 MG tablet Commonly known as: GLUCOPHAGE Take 1 tablet (1,000 mg total) by mouth 2 (two) times daily with a meal.   oseltamivir 75 MG capsule Commonly known as: TAMIFLU Take 1 capsule (75 mg total) by mouth 2 (two) times daily for 4 days.   potassium chloride SA 20 MEQ tablet Commonly known as: KLOR-CON M Take 1 tablet (20 mEq total) by mouth 2 (two) times daily for 10 days.        Discharge Exam: Filed Weights   04/10/23 1051  Weight: 108.9 kg      04/11/2023    7:21 AM 04/11/2023    5:53 AM 04/10/2023   11:43 PM  Vitals with BMI  Systolic 122 121 244  Diastolic 76 78 66  Pulse 77 74 78   General - Young morbidly obese African-American female, no apparent distress HEENT - PERRLA, EOMI, atraumatic head, non tender sinuses. Lung - Clear, rales, rhonchi, wheezes. Heart - S1, S2 heard, no murmurs, rubs, no pedal edema. Abdomen-soft, nontender, obese, bowel sounds good Neuro - Alert, awake and oriented x 3, non focal exam. Skin - Warm and dry.  Condition at discharge: stable  The results of significant diagnostics from this hospitalization (including imaging, microbiology, ancillary and laboratory) are listed below for reference.   Imaging Studies: US PELVIC COMPLETE WITH TRANSVAGINAL Result Date: 04/10/2023 CLINICAL DATA:  Vaginal bleeding. EXAM: TRANSABDOMINAL AND TRANSVAGINAL ULTRASOUND OF PELVIS TECHNIQUE: Both transabdominal and transvaginal ultrasound examinations of the pelvis were performed. Transabdominal technique was performed for global imaging of the pelvis including uterus, ovaries, adnexal regions, and pelvic cul-de-sac. It was necessary to proceed with endovaginal exam following the transabdominal exam to visualize the endometrium and ovaries. COMPARISON:  None  Available. FINDINGS: Uterus Measurements: 11.2 x 5.0 x 5.9 cm = volume: 172 mL. The uterus is anteverted and appears unremarkable. Endometrium Thickness: 11 mm.  No focal abnormality visualized. Right ovary The right ovary is not visualized. Left ovary Measurements: 3.0 x 2.4 x 2.4 cm = volume: 9.4 mL. Normal appearance/no adnexal mass. Other findings No abnormal free fluid. IMPRESSION: Nonvisualization of the right ovary, otherwise unremarkable pelvic ultrasound. Electronically Signed   By: Elgie Collard M.D.   On: 04/10/2023 16:39   DG Chest 2 View Result Date: 04/10/2023 CLINICAL DATA:  Syncope, flu positive EXAM: CHEST - 2 VIEW COMPARISON:  02/26/2018 FINDINGS: Stable cardiac enlargement. Minor basilar atelectasis. Negative for edema, pneumonia, collapse or consolidation. No large effusion or pneumothorax. Trachea midline. IMPRESSION: Cardiomegaly and minor basilar atelectasis. Electronically Signed   By: Judie Petit.  Shick M.D.   On: 04/10/2023 13:09    Microbiology: Results for orders placed or performed during the hospital encounter of 07/15/17  Culture, OB Urine  Status: Abnormal   Collection Time: 07/15/17 12:38 PM   Specimen: OB Clean Catch; Urine  Result Value Ref Range Status   Specimen Description   Final    OB CLEAN CATCH Performed at [facility], [address] [place], [place] [address]    Special Requests   Final    NONE Performed at [facility], [address] [place], [place] [address]    Culture (A)  Final    MULTIPLE SPECIES PRESENT, SUGGEST RECOLLECTION NO GROUP B STREP (S.AGALACTIAE) ISOLATED Performed at [facility], [address] [place], [place] [address]    Report Status 07/17/2017 FINAL  Final  Group B strep by PCR     Status: Abnormal   Collection Time: 07/15/17  1:40 PM   Specimen: Vaginal/Rectal; Genital  Result Value Ref Range Status   Group B strep by PCR POSITIVE (A) NEGATIVE Final    Comment: CRITICAL RESULT CALLED TO, READ BACK BY  AND VERIFIED WITH: Thayer Headings AT 1448 ON [id number] BY FORSYTH K Performed at [facility], [address], [place], [place] [address]   Wet prep, genital     Status: Abnormal   Collection Time: 07/15/17  1:40 PM   Specimen: Vaginal/Rectal  Result Value Ref Range Status   Yeast Wet Prep HPF POC NONE SEEN NONE SEEN Final   Trich, Wet Prep NONE SEEN NONE SEEN Final   Clue Cells Wet Prep HPF POC NONE SEEN NONE SEEN Final   WBC, Wet Prep HPF POC MANY (A) NONE SEEN Final    Comment: MANY BACTERIA SEEN   Sperm NONE SEEN  Final    Comment: Performed at [facility], [address], [place], [place] [address]    Labs: CBC: Recent Labs  Lab 04/10/23 1055 04/11/23 0443  WBC 7.1 5.4  HGB 5.3* 7.0*  HCT 20.0* 23.9*  MCV 66.7* 69.3*  PLT 277 222   Basic Metabolic Panel: Recent Labs  Lab 04/10/23 1055 04/11/23 0443  NA 132* 135  K 3.8 3.2*  CL 102 103  CO2 20* 22  GLUCOSE 240* 181*  BUN 9 8  CREATININE 0.75 0.60  CALCIUM 9.0 9.0   Liver Function Tests: No results for input(s): "AST", "ALT", "ALKPHOS", "BILITOT", "PROT", "ALBUMIN" in the last 168 hours. CBG: Recent Labs  Lab 04/10/23 2002 04/11/23 1002 04/11/23 1148  GLUCAP 178* 151* 241*    Discharge time spent: 36 minutes.  Signed: Marcelino Duster, MD [facility] 04/11/2023

## 2023-04-11 NOTE — Inpatient Diabetes Management (Incomplete)
Inpatient Diabetes Program Recommendations  AACE/ADA: New Consensus Statement on Inpatient Glycemic Control (2015)  Target Ranges:  Prepandial:   less than 140 mg/dL      Peak postprandial:   less than 180 mg/dL (1-2 hours)      Critically ill patients:  140 - 180 mg/dL   Lab Results  Component Value Date   GLUCAP 241 (H) 04/11/2023   HGBA1C 8.0 (H) 04/10/2023    Review of Glycemic Control  Latest Reference Range & Units 04/10/23 20:02 04/11/23 10:02 04/11/23 11:48  Glucose-Capillary 70 - 99 mg/dL 914 (H) 782 (H) 956 (H)   Diabetes history: DM - new diagnosis (history of gestational DM) Outpatient Diabetes medications:  None Current orders for Inpatient glycemic control:  Novolog 0-9 units tid with meals and HS Inpatient Diabetes Program Recommendations:    Contacted by RN regarding new diagnosis of DM.  Called patient prior to discharge.  She states that she had gestational DM and is familiar with how to check blood sugars
